# Patient Record
Sex: Male | Born: 1970 | Race: White | Hispanic: No | Marital: Married | Smoking: Never smoker
Health system: Southern US, Community
[De-identification: ages and names within clinical notes are randomized; demographics above are authoritative.]

## PROBLEM LIST (undated history)

## (undated) DIAGNOSIS — R0789 Other chest pain: Secondary | ICD-10-CM

## (undated) DIAGNOSIS — E119 Type 2 diabetes mellitus without complications: Secondary | ICD-10-CM

## (undated) DIAGNOSIS — M5136 Other intervertebral disc degeneration, lumbar region: Secondary | ICD-10-CM

## (undated) DIAGNOSIS — G4730 Sleep apnea, unspecified: Secondary | ICD-10-CM

## (undated) DIAGNOSIS — M51369 Other intervertebral disc degeneration, lumbar region without mention of lumbar back pain or lower extremity pain: Secondary | ICD-10-CM

## (undated) DIAGNOSIS — I456 Pre-excitation syndrome: Secondary | ICD-10-CM

## (undated) DIAGNOSIS — E785 Hyperlipidemia, unspecified: Secondary | ICD-10-CM

## (undated) DIAGNOSIS — M5126 Other intervertebral disc displacement, lumbar region: Secondary | ICD-10-CM

## (undated) DIAGNOSIS — T7840XA Allergy, unspecified, initial encounter: Secondary | ICD-10-CM

## (undated) DIAGNOSIS — J302 Other seasonal allergic rhinitis: Secondary | ICD-10-CM

## (undated) DIAGNOSIS — I4510 Unspecified right bundle-branch block: Secondary | ICD-10-CM

## (undated) DIAGNOSIS — K219 Gastro-esophageal reflux disease without esophagitis: Secondary | ICD-10-CM

## (undated) DIAGNOSIS — R0683 Snoring: Secondary | ICD-10-CM

## (undated) HISTORY — DX: Type 2 diabetes mellitus without complications: E11.9

## (undated) HISTORY — DX: Sleep apnea, unspecified: G47.30

## (undated) HISTORY — DX: Unspecified right bundle-branch block: I45.10

## (undated) HISTORY — DX: Other chest pain: R07.89

## (undated) HISTORY — PX: NO PAST SURGERIES: SHX2092

## (undated) HISTORY — PX: WISDOM TOOTH EXTRACTION: SHX21

## (undated) HISTORY — DX: Other intervertebral disc displacement, lumbar region: M51.26

## (undated) HISTORY — DX: Other intervertebral disc degeneration, lumbar region without mention of lumbar back pain or lower extremity pain: M51.369

## (undated) HISTORY — DX: Gastro-esophageal reflux disease without esophagitis: K21.9

## (undated) HISTORY — DX: Other intervertebral disc degeneration, lumbar region: M51.36

## (undated) HISTORY — DX: Allergy, unspecified, initial encounter: T78.40XA

## (undated) HISTORY — DX: Snoring: R06.83

## (undated) HISTORY — DX: Hyperlipidemia, unspecified: E78.5

## (undated) HISTORY — PX: SHOULDER SURGERY: SHX246

## (undated) HISTORY — DX: Other seasonal allergic rhinitis: J30.2

## (undated) HISTORY — DX: Pre-excitation syndrome: I45.6

---

## 2005-03-31 ENCOUNTER — Inpatient Hospital Stay (HOSPITAL_COMMUNITY): Admission: EM | Admit: 2005-03-31 | Discharge: 2005-04-02 | Payer: Self-pay | Admitting: Emergency Medicine

## 2005-04-01 ENCOUNTER — Encounter (INDEPENDENT_AMBULATORY_CARE_PROVIDER_SITE_OTHER): Payer: Self-pay | Admitting: *Deleted

## 2005-04-23 ENCOUNTER — Encounter: Admission: RE | Admit: 2005-04-23 | Discharge: 2005-04-23 | Payer: Self-pay | Admitting: Gastroenterology

## 2005-04-30 ENCOUNTER — Ambulatory Visit (HOSPITAL_COMMUNITY): Admission: RE | Admit: 2005-04-30 | Discharge: 2005-04-30 | Payer: Self-pay | Admitting: Urology

## 2005-10-08 ENCOUNTER — Encounter: Admission: RE | Admit: 2005-10-08 | Discharge: 2005-10-08 | Payer: Self-pay | Admitting: Sports Medicine

## 2006-02-06 ENCOUNTER — Ambulatory Visit (HOSPITAL_BASED_OUTPATIENT_CLINIC_OR_DEPARTMENT_OTHER): Admission: RE | Admit: 2006-02-06 | Discharge: 2006-02-06 | Payer: Self-pay | Admitting: Orthopedic Surgery

## 2006-10-05 ENCOUNTER — Encounter: Admission: RE | Admit: 2006-10-05 | Discharge: 2006-10-05 | Payer: Self-pay | Admitting: Urology

## 2006-10-28 ENCOUNTER — Ambulatory Visit (HOSPITAL_COMMUNITY): Admission: RE | Admit: 2006-10-28 | Discharge: 2006-10-28 | Payer: Self-pay | Admitting: Urology

## 2007-06-06 IMAGING — US US ABDOMEN COMPLETE
1 series · 13 of 25 positions shown · non-contrast
Comparison: none

CLINICAL DATA: Cystic mass upper pole right kidney, follow-up.
 ABDOMEN ULTRASOUND:
TECHNIQUE: Complete abdominal ultrasound examination was performed including evaluation of the liver, gallbladder, bile ducts, pancreas, kidneys, spleen, IVC, and abdominal aorta.
 The complex septated hypoechogenic lesion of the upper pole of the right kidney has not changed when compared to the ultrasound of 04/23/05 and to the MR scan of 04/30/05.  This complex cystic lesion measures 2.0 x 1.4 x 2.0 cm.  There is minimal blood flow on the periphery of this lesion.  One additional follow-up ultrasound in one year is recommended and, if this area stays stable at that time, then no further assessment may be necessary.  The right kidney measures 9.8 cm sagittally with the left kidney measuring 9.7 cm.  The remainder of the study shows small gallbladder polyp of 3 mm.  No gallstones are seen. The liver has a normal echogenic pattern.   The common bile duct is normal measuring 2.4 mm.  The pancreas is not well seen due to bowel gas.  Spleen is normal in size.  The abdominal aorta appears normal.

[Series 1: us abdomen complete · 0.26mm/px · 13 of 95 slices shown]
[im 1/95]
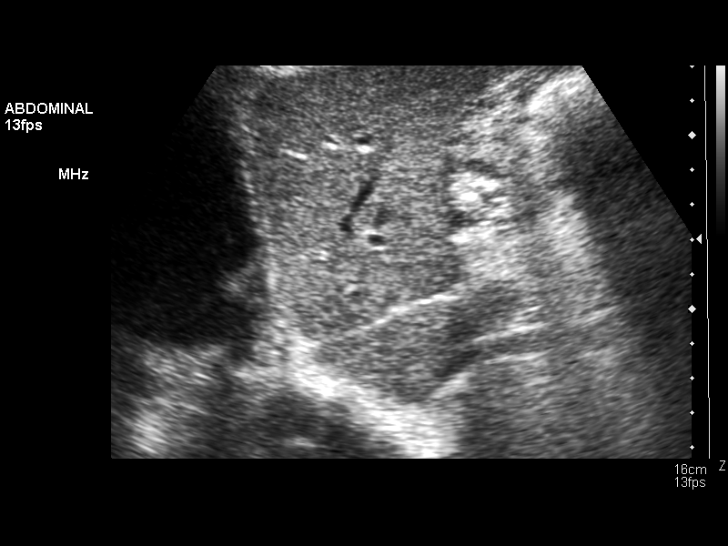
[im 8/95]
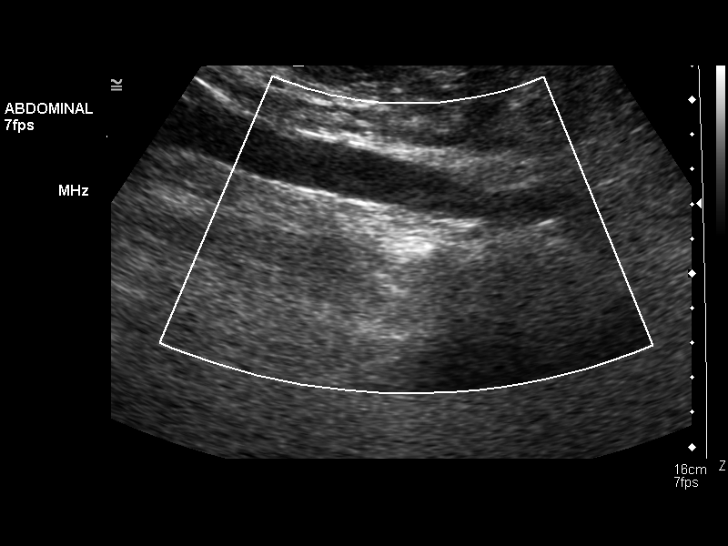
[im 16/95]
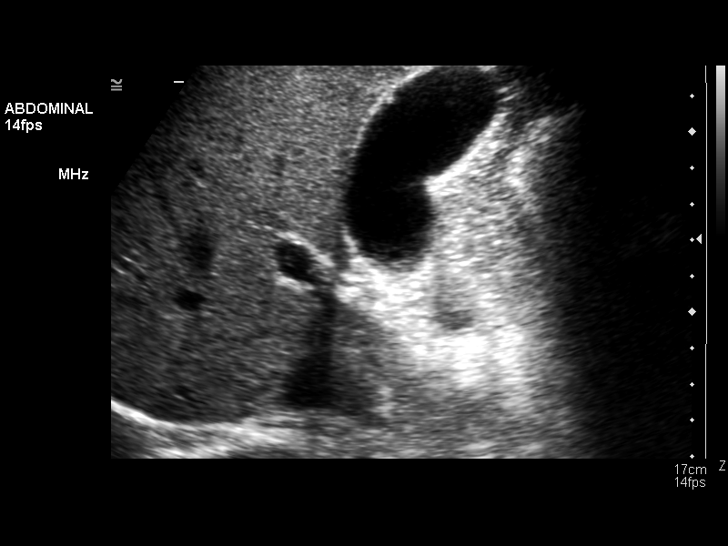
[im 24/95]
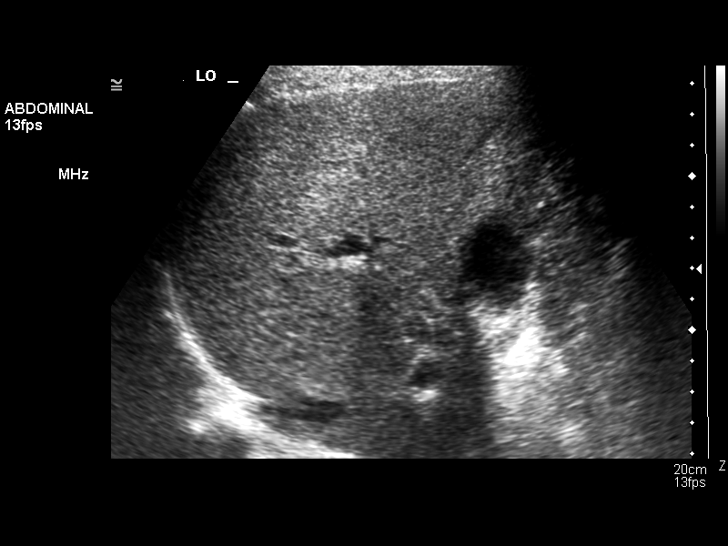
[im 32/95]
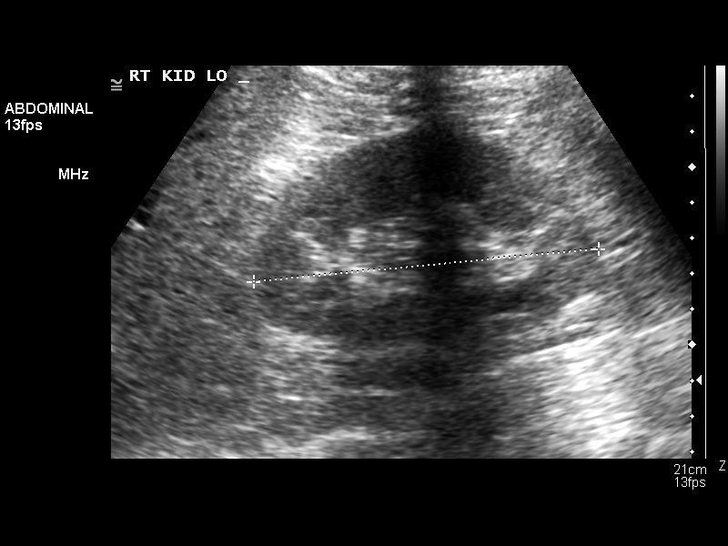
[im 40/95]
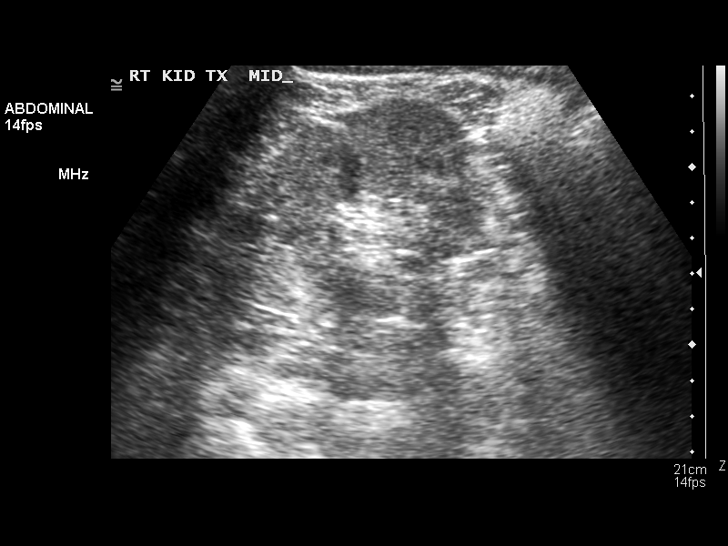
[im 48/95]
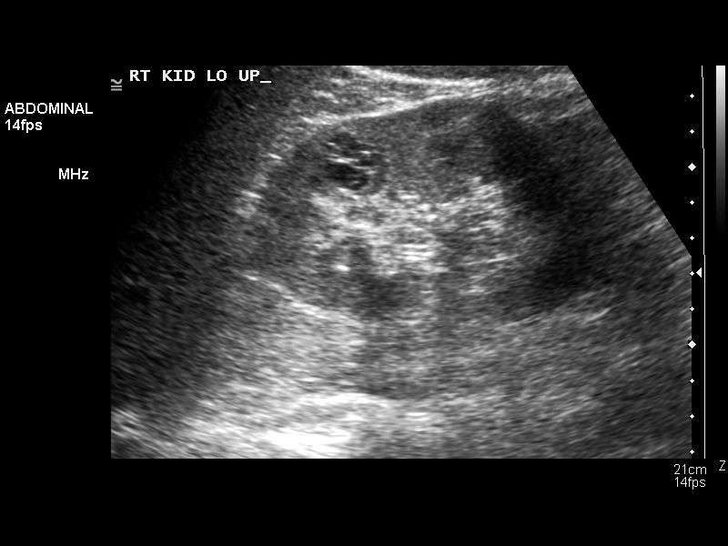
[im 55/95]
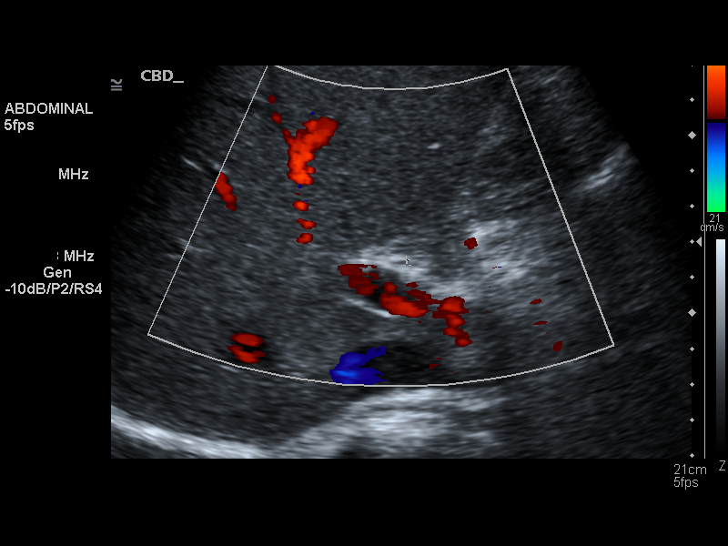
[im 63/95]
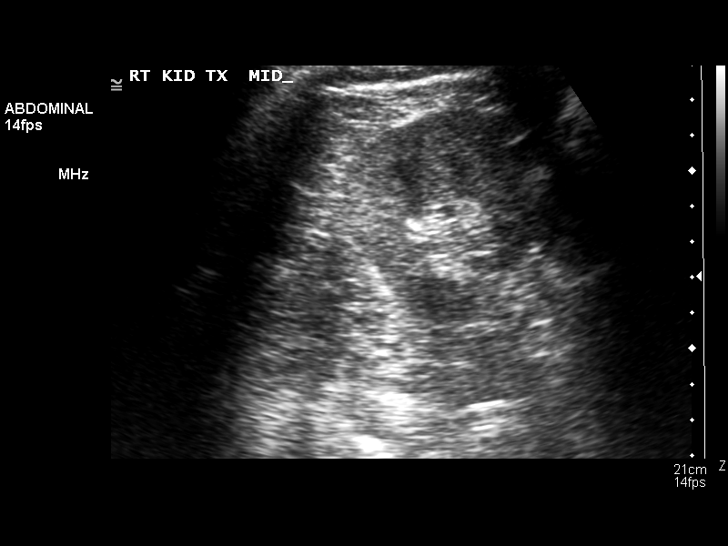
[im 71/95]
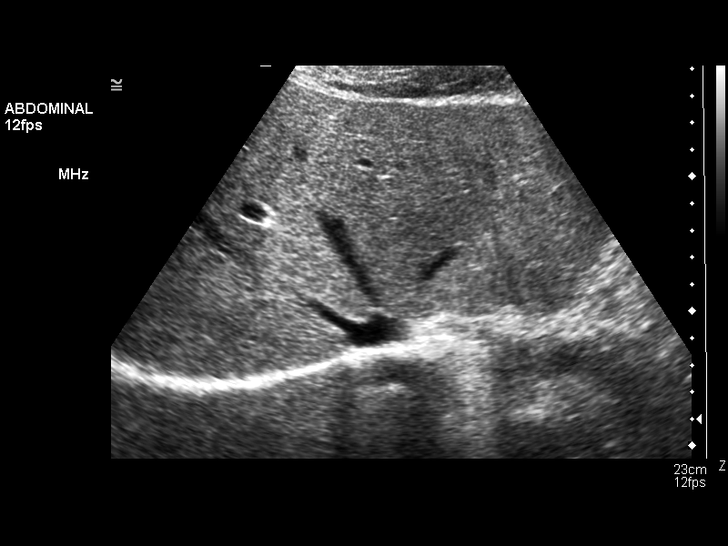
[im 79/95]
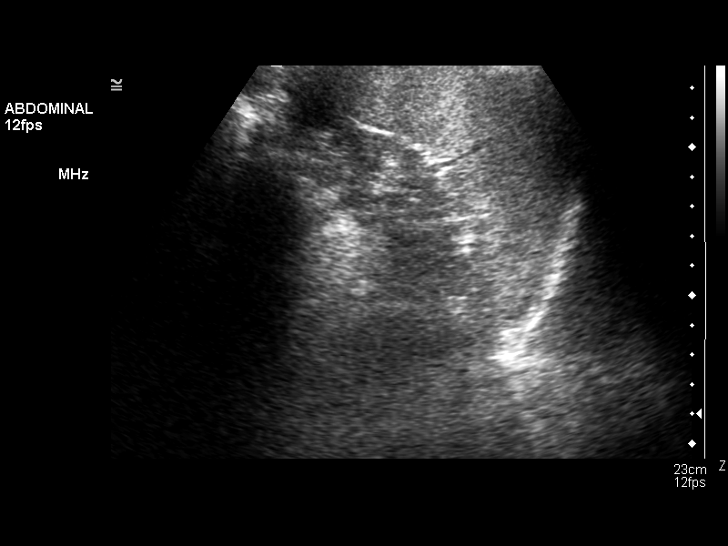
[im 87/95]
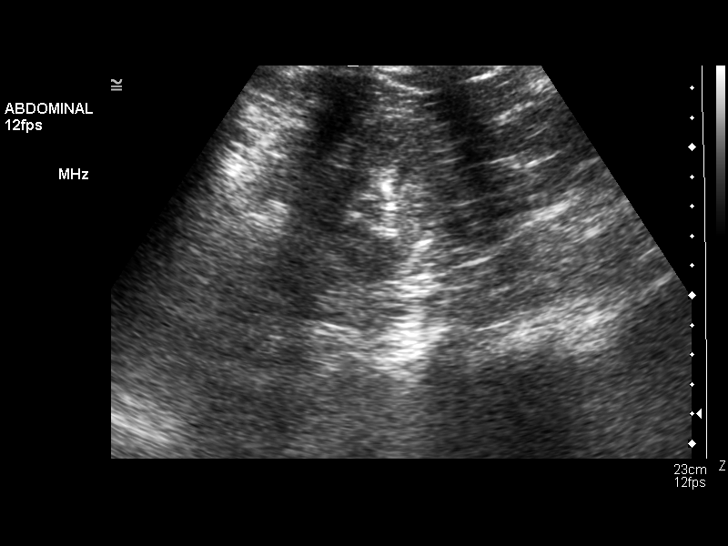
[im 95/95]
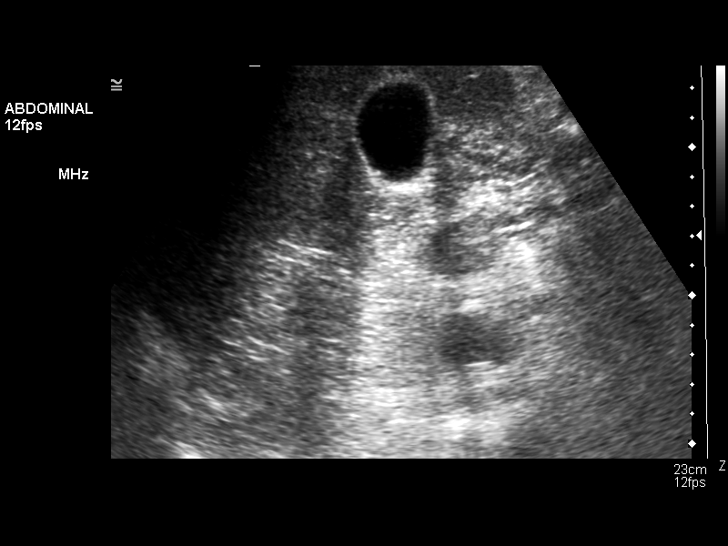

[13 of 25 positions shown; findings below may reference images not displayed]

IMPRESSION: 1.  Stable complex septated cystic structure in the upper pole of the right kidney of 2.0 cm.  Recommend one additional follow-up ultrasound in one year.
 2 . Single 3 mm gallbladder polyp.  No gallstones.
 3.  Pancreas not well seen.

## 2008-06-02 IMAGING — US US RENAL
1 series · 13 of 24 positions shown · non-contrast
Comparison: Abdominal ultrasound, 10/08/05.

CLINICAL DATA: Follow up complex right renal cyst.
 RENAL ULTRASOUND:
TECHNIQUE: Complete ultrasound examination of the urinary tract was performed including evaluation of the kidneys, renal collecting systems, and urinary bladder.

[Series 1: unknown · 0.22mm/px · 13 of 24 slices shown]
[im 1/24]
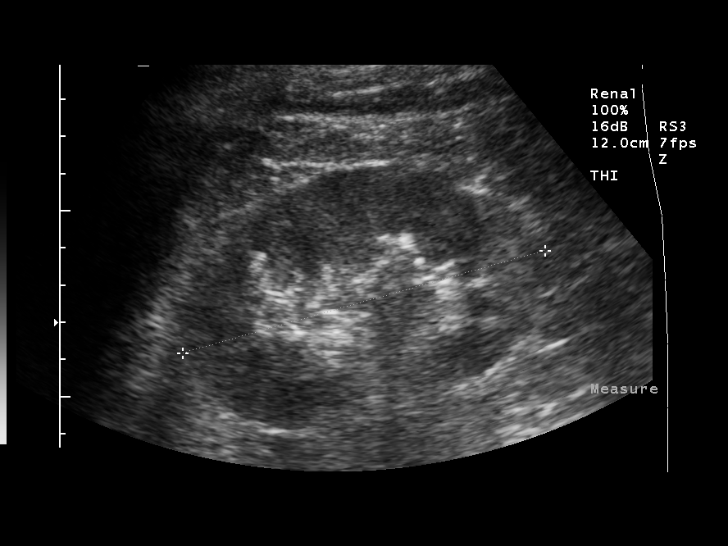
[im 3/24]
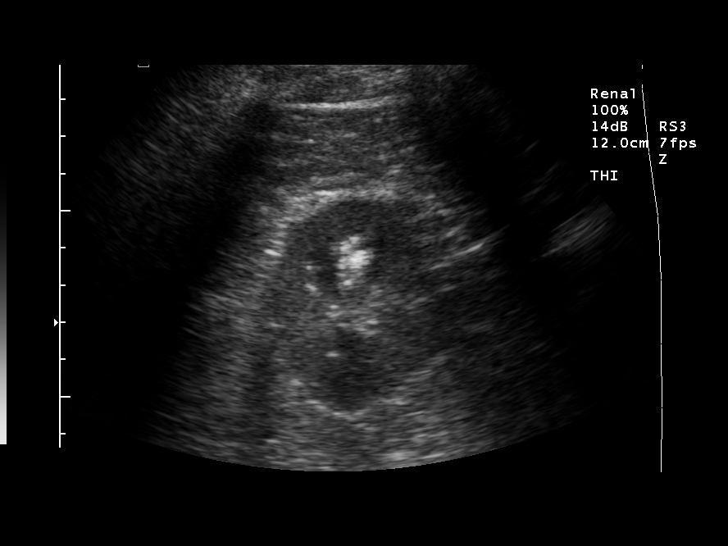
[im 5/24]
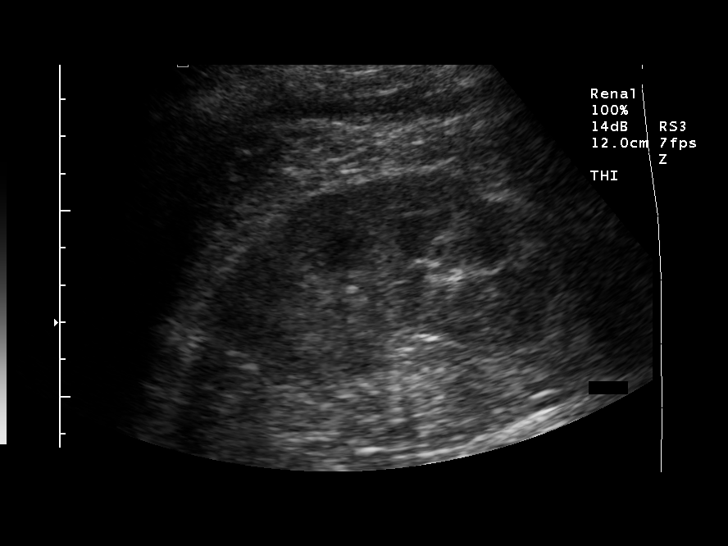
[im 7/24]
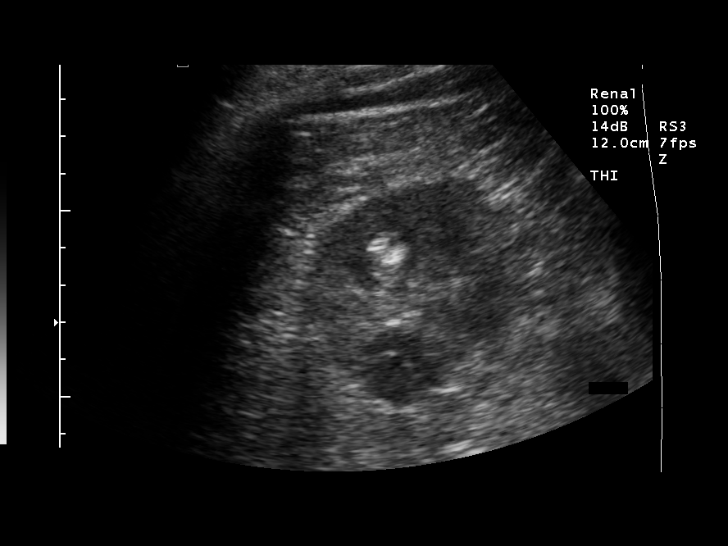
[im 9/24]
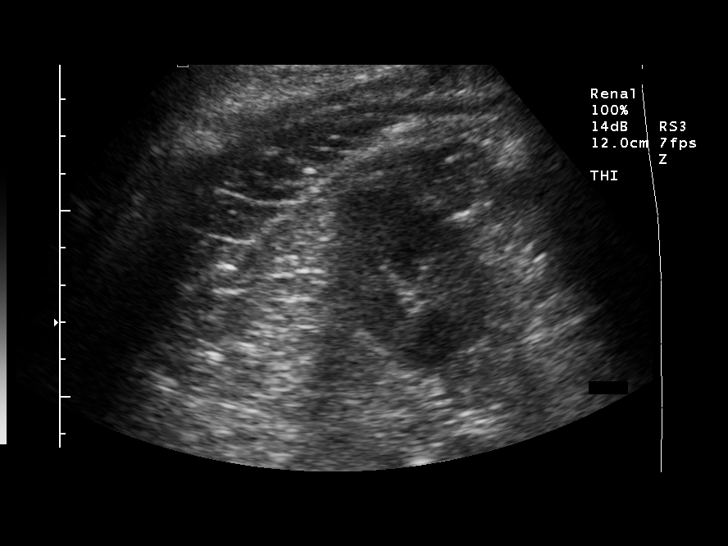
[im 11/24]
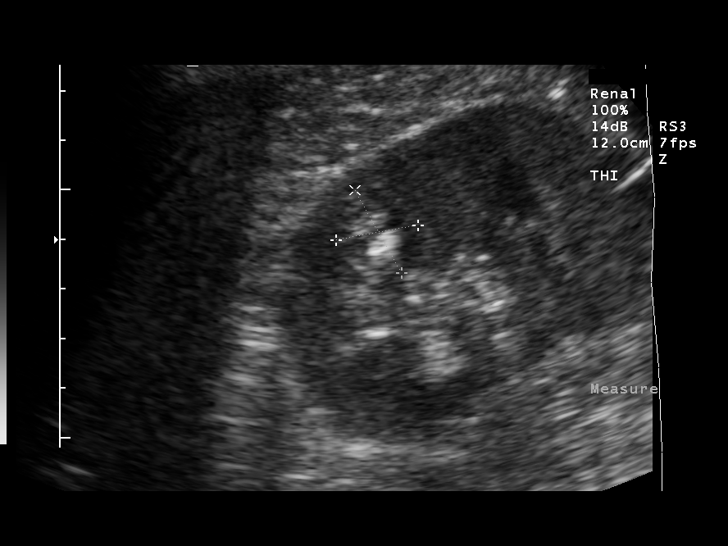
[im 13/24]
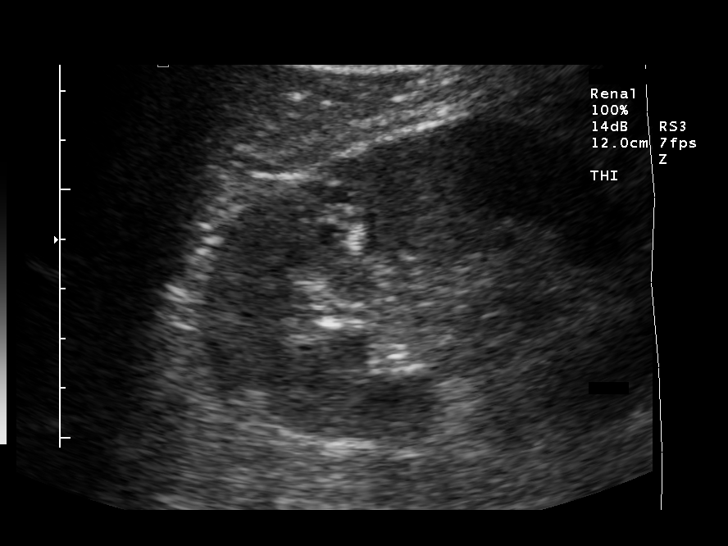
[im 14/24]
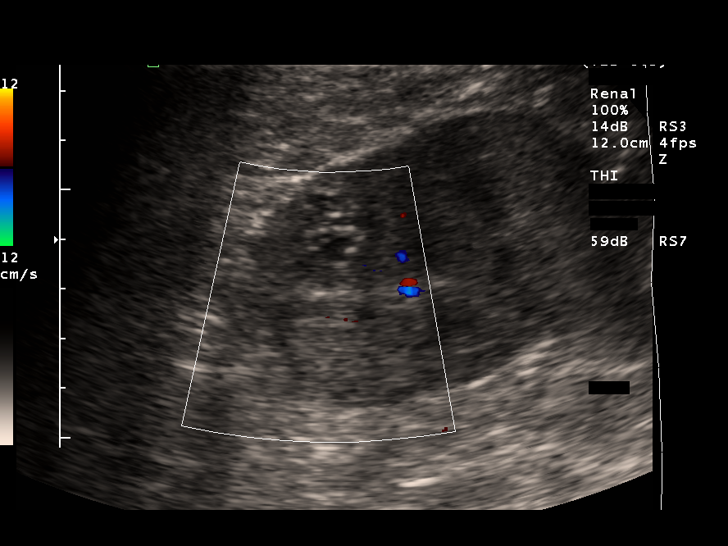
[im 16/24]
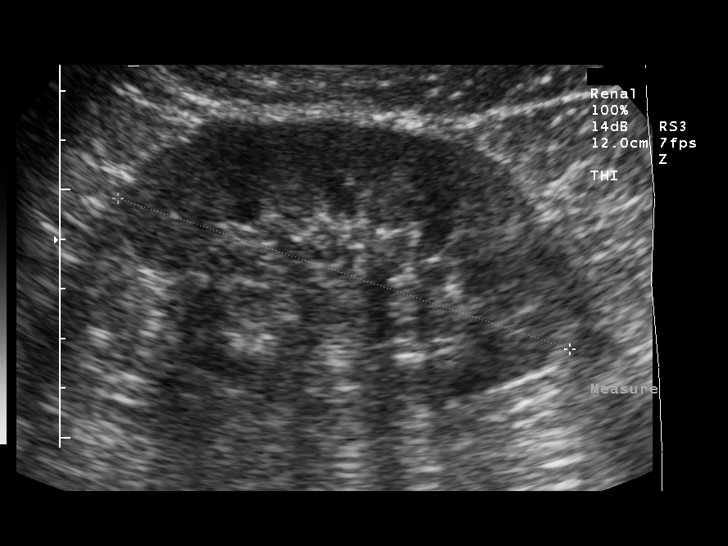
[im 18/24]
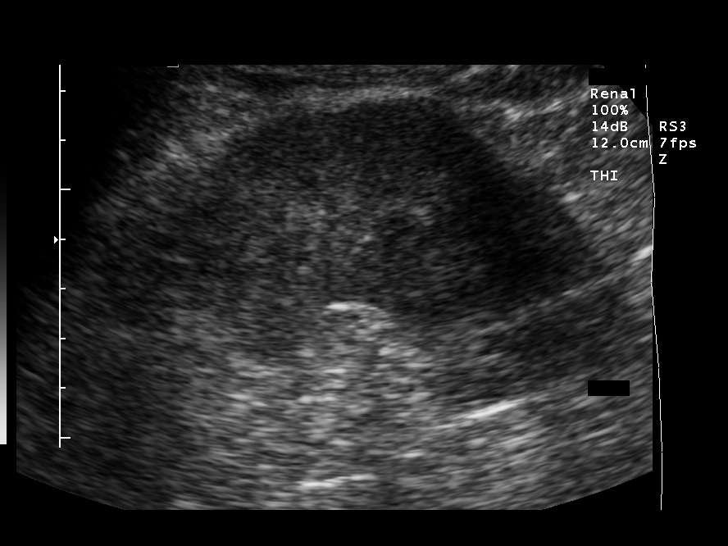
[im 20/24]
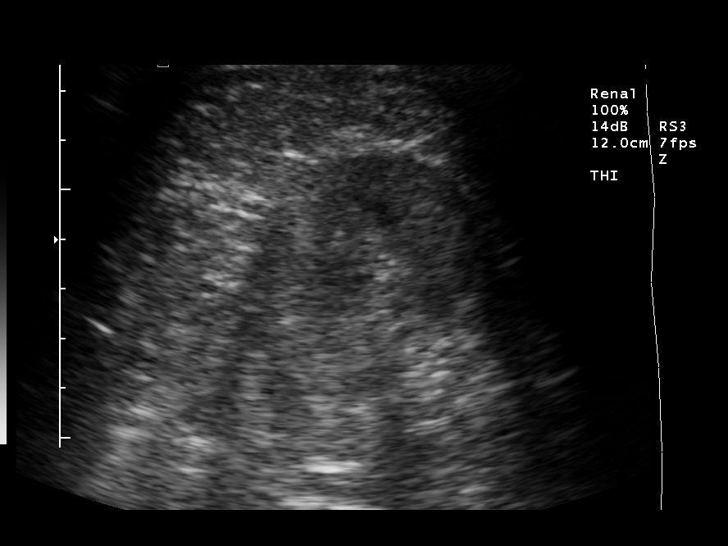
[im 22/24]
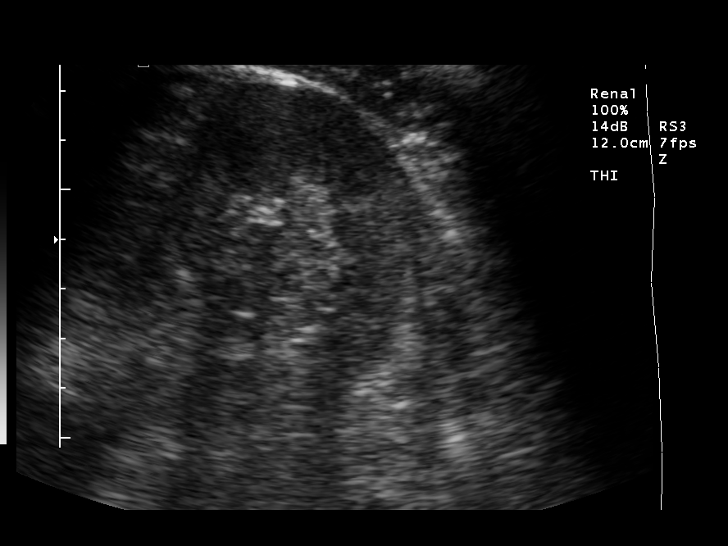
[im 24/24]
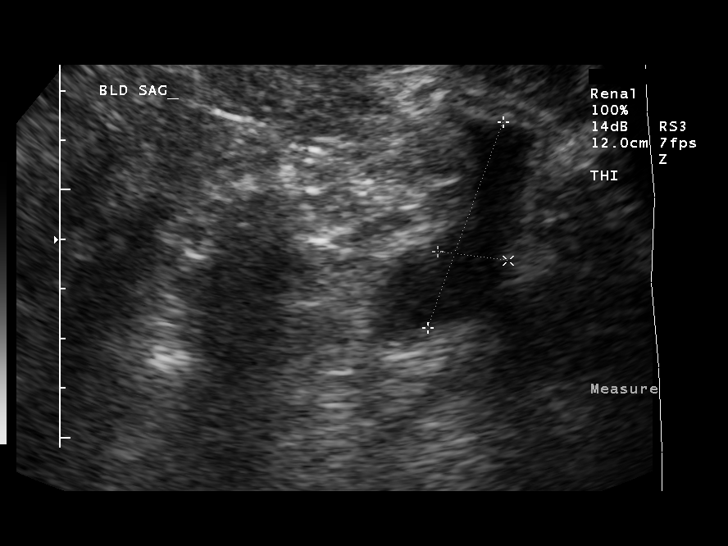

[13 of 24 positions shown; findings below may reference images not displayed]

FINDINGS: A complex lesion in the upper pole of the right kidney is again noted.  It measures 1.7 x 1.9 x 1.9 cm.  Previously it was measured at 2.0 x 2.0 x 1.4 cm.  The lesion is, therefore, not enlarged.   However, the internal contents are far more echo dense than before suggesting calcification.  Lack of interval growth strongly suggested this a benign lesion, even though it has developed calcification within it.  One might consider a follow-up ultrasound in 6 to 12 months to assure ourselves that this is not an occult neoplasm.  However, I believe it is highly unlikely since the lesion has not grown.  In fact, it measures slightly smaller.  
 No other lesions are identified.  Kidney size remains normal with the right 10.1 and the left 9.6 cm.
IMPRESSION: Complex lesion in the upper pole of the right kidney with interval development of central calcifications, but no increase in overall growth (in fact, the lesion measures slightly smaller).  See discussion.

## 2008-06-19 IMAGING — CR DG CHEST 2V
2 series · 2 of 2 positions shown · non-contrast
Comparison: 03/31/05.

CLINICAL DATA: Preop for hernia repair and vasectomy.  
 CHEST ? 2 VIEW:

[w chest pa]
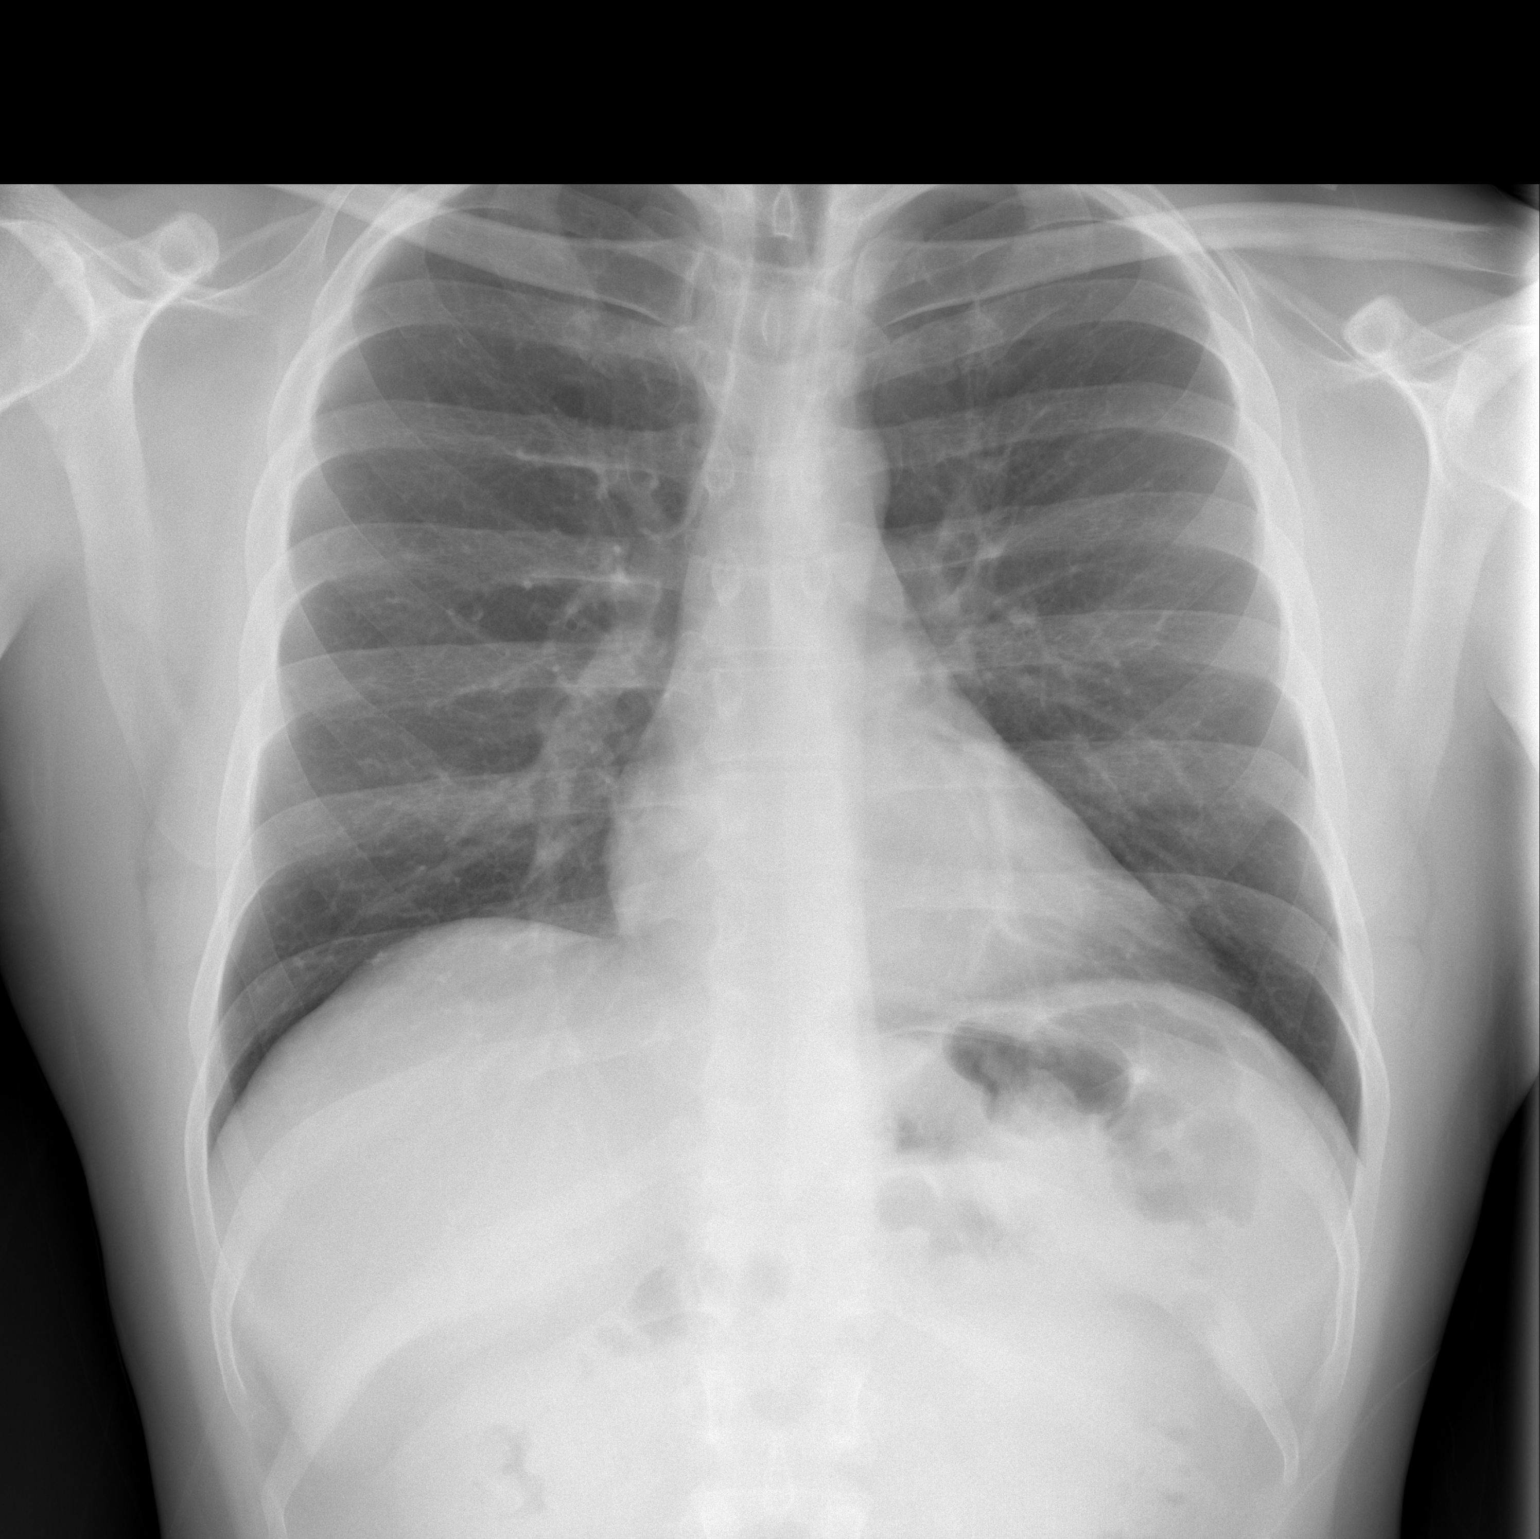

[w chest lat]
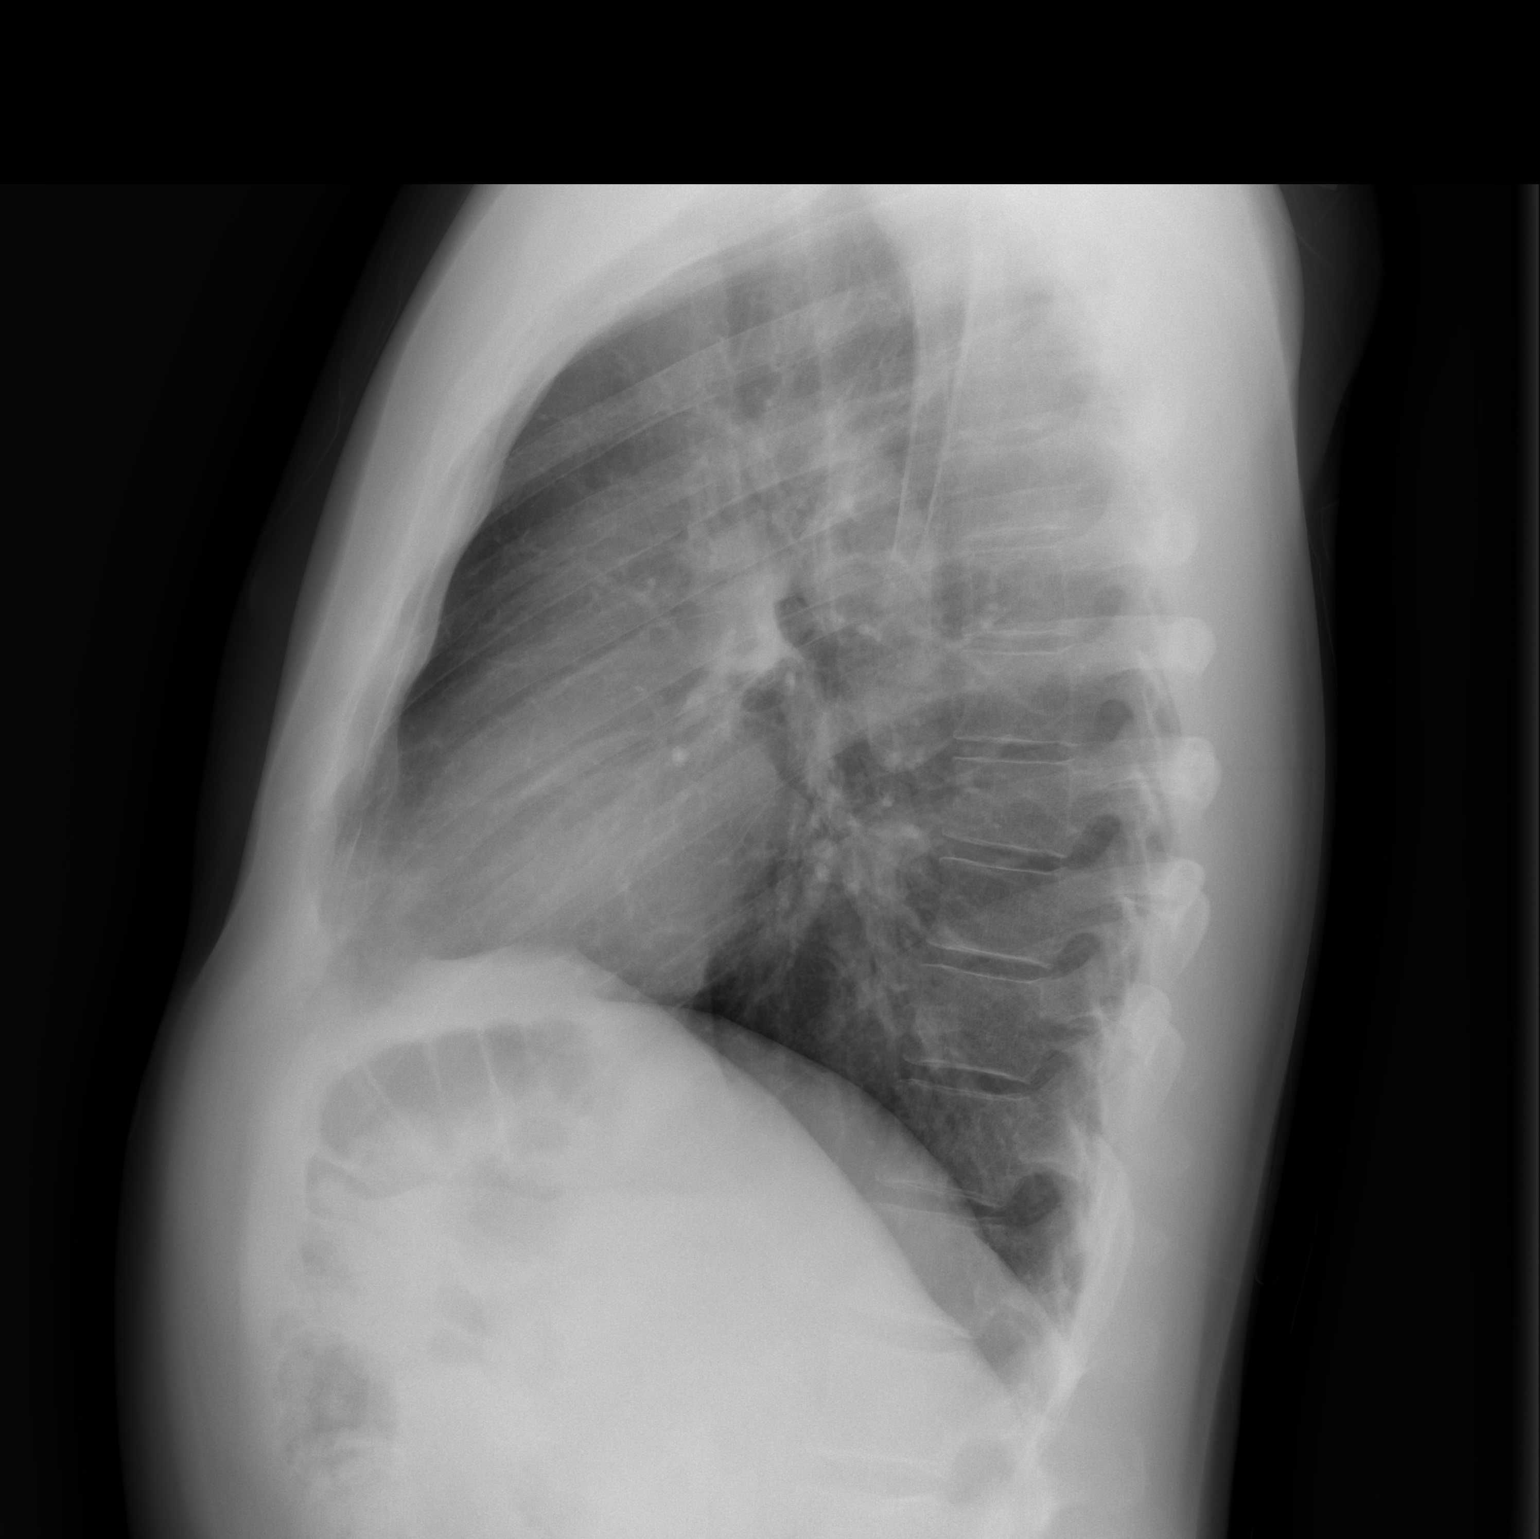

[2 of 2 positions shown; findings below may reference images not displayed]

FINDINGS: The heart size and mediastinal contours are within normal limits.  Both lungs are clear.  The visualized skeletal structures are unremarkable.
IMPRESSION: No active cardiopulmonary disease.

## 2009-03-16 ENCOUNTER — Ambulatory Visit (HOSPITAL_BASED_OUTPATIENT_CLINIC_OR_DEPARTMENT_OTHER): Admission: RE | Admit: 2009-03-16 | Discharge: 2009-03-16 | Payer: Self-pay | Admitting: Orthopedic Surgery

## 2010-03-03 HISTORY — PX: SHOULDER ARTHROSCOPY WITH ROTATOR CUFF REPAIR AND OPEN BICEPS TENODESIS: SHX6677

## 2010-03-24 ENCOUNTER — Encounter: Payer: Self-pay | Admitting: Family Medicine

## 2010-07-16 NOTE — Op Note (Signed)
Connor Tate, Connor Tate               ACCOUNT NO.:  [id number]   MEDICAL RECORD NO.:  [id number]          PATIENT TYPE:  AMB   LOCATION:  DAY                          FACILITY:  [facility]   PHYSICIAN:  Lindaann Slough, M.D.  DATE OF BIRTH:  19-Nov-1970   DATE OF PROCEDURE:  10/28/2006  DATE OF DISCHARGE:                               OPERATIVE REPORT   PREOPERATIVE DIAGNOSIS:  Elective sterilization and right inguinal  hernia.   POSTOPERATIVE DIAGNOSIS:  Elective sterilization and right inguinal  hernia.   PROCEDURE:  Bilateral vasectomy by Dr. Judie Petit. Nesi and right inguinal hernia  repair by Dr. Lurene Shadow,   SURGEON:  Danae Chen, M.D.   ASSISTANT:  Leonie Man, M.D.   ANESTHESIA:  General.   INDICATIONS:  The patient is a 40 years old male who was seen in the  office on October 01, 2006 for vasectomy consultation.  He has two children  age 81 and six and both him and his wife think that their family is  complete.  He was found on physical examination to have a right inguinal  hernia and was referred to Dr. Lurene Shadow for evaluation and management.  He  is scheduled today for right inguinal hernia repair and bilateral  vasectomy.   Under general anesthesia the patient was prepped and draped and placed  in the supine position.  A right inguinal hernia repair was done by Dr.  Lurene Shadow.  He will dictate this procedure separately.   On the right side the srotal segment of the cord was pulled into the  inguinal incision and the vas was dissected from the rest of the cord  and a segment of the vas was excised.  Each end of the vas was then  fulgurated and ligated with #0 Vicryl.  The proximal end of the vas was  then covered with the vas sheath and then the cord was pulled back in  the scrotum.  Then Dr. Lurene Shadow completed the inguinal hernia repair.  On  the left side I made a poke hole on the scrotum and then the vas was  then secured with a vas clamp.  The vas was then dissected from the vas  sheath and a segment of the vas was excised.  Each end of the vas was  then fulgurated with the electrocautery.  The proximal end of the vas  was then covered with the vas sheath and the distal end was left in the  subcutaneous tissues.  The skin incision was then closed with #3-0  Vicryl.   The patient tolerated the procedure well.      Lindaann Slough, M.D.  Electronically Signed     MN/MEDQ  D:  10/28/2006  T:  10/29/2006  Job:  [id number]

## 2010-07-16 NOTE — Op Note (Signed)
[name], [name] NO.:  [id number]   MEDICAL RECORD NO.:  [id number]          PATIENT TYPE:  AMB   LOCATION:  DAY                          FACILITY:  [facility]   PHYSICIAN:  Leonie Man, M.D.   DATE OF BIRTH:  1970-06-25   DATE OF PROCEDURE:  10/28/2006  DATE OF DISCHARGE:                               OPERATIVE REPORT   PREOPERATIVE DIAGNOSIS:  Right inguinal hernia.   POSTOPERATIVE DIAGNOSIS:  Right inguinal hernia.   PROCEDURE:  Right inguinal herniorrhaphy with mesh.   SURGEON:  Leonie Man, M.D.   ASSISTANT:  Lindaann Slough, M.D.   ANESTHESIA:  General.   SPECIMENS:  There are no specimens sent to the lab.   DISPOSITION:  The patient returned to the PACU in excellent condition.   BLOOD LOSS:  Minimal.   INDICATIONS:  The patient is a 40 year old man who presents to his  urologist requesting bilateral vasectomy.  At that time he was noted to  have a right-sided inguinal hernia.  Our plan was to repair the right  inguinal hernia in conjunction with the urologist, Dr. Su Grand who  would perform his vasectomy.  Please note that the procedure for the  vasectomy is dictated in a separate note.   The risks and potential benefits of surgery have been fully discussed  with the patient, all questions answered and consent obtained.   DESCRIPTION OF PROCEDURE:  Following the induction of satisfactory  general anesthesia with the patient positioned supinely, the lower  abdomen and scrotum and penis are prepped and draped to be included in a  sterile operative field.  Positive identification of the patient as  Connor Tate and the operation is  right inguinal hernia and bilateral vasectomies was made.   A transverse incision in the right lower quadrant was made, deepened  through the skin and subcutaneous tissues down to the external oblique  aponeurosis.  The external oblique aponeurosis was opened up through the  external inguinal ring with  protection of the ilioinguinal nerve. The  spermatic cord was elevated and held with a Penrose drain.   Dissection along the anterior medial aspect of the spermatic cord showed  a very large cord lipoma which was dissected free from the cord contents  and carried up to the internal ring.  The lipoma was explored and noted  to contain only fatty tissue; and there was no sac.  Lipoma was then  suture ligated at its base, and transected with the stump being released  into the retroperitoneum.   In the direct space there was a large direct inguinal hernia; and this  was repaired with an onlay patch of polypropylene mesh which was sewn in  at the pubic tubercle, carried up along the conjoined tendon with a  running suture of 2-0 Novofil up to the internal ring; and , again, from  the pubic tubercle up along the shelving edge of Poupart's ligament up  to the internal ring.  The mesh was split so as to allow the protrusion  of the spermatic cord between the leaflets of the mesh; and of  tails of  the mesh were then sutured down to the internal oblique muscle behind  the cord.   The area between the cord and mesh was then tested; and there was no  vascular compromise.  At this point Dr. Brunilda Payor performed a vasectomy on  the right side.  The sponge, instruments, and sharp counts were then  verified on the right side.  All areas of dissection were checked for  hemostasis and noted to be dry.  The external oblique aponeurosis was  closed with a running suture of 2-0 Vicryl, thus reapproximating the  external ring.  Scarpa fascia and the subcutaneous tissues were closed  with a running suture of 3-0 Vicryl; and the skin closed with a running  4-0 Monocryl suture; and then reinforced with Steri-Strips.   In the interim Dr. Brunilda Payor performed a vasectomy on the left side.  Sterile  dressing were applied to all incisions, anesthetic reversed.  The  patient removed from the operating room to the recovery  room in stable  condition.  He tolerated the procedure well.      Leonie Man, M.D.  Electronically Signed     PB/MEDQ  D:  10/28/2006  T:  10/29/2006  Job:  [id number]   cc:   Lindaann Slough, M.D.  Fax: [phone]

## 2010-07-19 NOTE — H&P (Signed)
NAME:  BOWEN, KIA               ACCOUNT NO.:  [id number]   MEDICAL RECORD NO.:  [id number]          PATIENT TYPE:  EMS   LOCATION:  MAJO                         FACILITY:  [facility]   PHYSICIAN:  Melissa L. Ladona Ridgel, MD  DATE OF BIRTH:  [birth year]   DATE OF ADMISSION:  03/31/2005  DATE OF DISCHARGE:                                HISTORY & PHYSICAL   CHIEF COMPLAINT:  Chest pain.   PRIMARY CARE PHYSICIAN:  Dr. Blossom Hoops   HISTORY OF PRESENT ILLNESS:  The patient is a 40 year old white male with 2-  3 years of on-and-off chest discomfort which he describes as being 10/10  that cause him to drop to the ground to his knees with central location and  associated shortness of breath. They usually resolve spontaneously or with  stretching. The patient states that he had never spoke to anybody about  these including his physician, but over the last 3 weeks he has had  worsening episodes of increased frequency and intensity. He states that last  night he was asleep when he was awakened from sleep with associated chest  pain centrally located and short of breath. He stood up and went downstairs  and took some aspirin. He states that the pain eventually went away and so  he got on the Internet to look and see what possible diagnoses he could  have. This morning his wife encouraged him to go to his physician and  therefore he called Dr. Laqueta Linden office, who directed him to the  emergency room.   REVIEW OF SYSTEMS:  Shows that his pain is usually associated with some  nausea but no vomiting. It has woken him from sleep. It does not radiate  down his arms or to his back. All other review of systems appear to be  negative.   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   PAST SURGICAL HISTORY:  None other than some epidural steroid injections for  his back.   SOCIAL HISTORY:  He does not smoke. He drinks maybe monthly. He denies any  illicit drug use. 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   FAMILY HISTORY:  Mom had a myocardial infarction at age 24. Dad is unknown  to him. He does have a step-father. He does know that his granddad had a  myocardial infarction which caused his death; age is unknown.   ALLERGIES:  SULFA.   MEDICATIONS:  Flexeril as needed for his back, and he has not taken that in  a while.   PHYSICAL EXAMINATION:  VITAL SIGNS:  Temperature is 98.2, blood pressure  143/81, pulse is 62, respirations 14, saturation 99%.  GENERAL:  He is in no acute distress. At the present he has no pain.  HEENT:  He is normocephalic, atraumatic. Pupils equal, round, and reactive  to light. Extraocular muscles intact. Mucous membranes are moist.  NECK:  Supple. There is no JVD, no lymph nodes, and no carotid bruits.  CHEST:  Clear to auscultation. There is no rhonchi, rales, or wheezes.  CARDIOVASCULAR:  There is regular  rate and rhythm. Positive S1, S2. No S3,  S4. No murmurs, rubs, or gallops are noted.  ABDOMEN:  Soft, nontender, nondistended, with positive bowel sounds.  EXTREMITIES:  Show no clubbing, cyanosis, or edema.  NEUROLOGIC:  He is awake, alert, oriented x3. Cranial nerves II-XII are  intact. Power is 5/5. DTRs are 2+.   LABORATORY:  Reveal point-of-care enzymes are negative x3. EKG shows an  incomplete bundle-branch block with right axis deviation, heart rate of 74.  Creatinine is 0.9, sodium is 136, potassium 4.0, chloride 107, CO2 is 30,  BUN is 11, creatinine 0.9, glucose is 91.   ASSESSMENT AND PLAN:  This is a 40 year old white male with 2-3 years of on-  and-off acute chest pain with varying settings of onset. It resolves  spontaneously or with stretching. The last 3 weeks the patient has an  increased frequency and intensity of episodes. His last event was last p.m.  which woke him from sleep and was associated with nausea and shortness of  breath. The patient took two aspirin and then went on the Internet to see  if  he could describe what was happening.   1.  Cardiovascular. Atypical chest pain with limited risk factors being male      sex and the fact that his mother had an MI at the age of 66. We will      place him on aspirin, Lovenox, Lopressor, and cardiology consult has      been called for potential stress testing.  2.  Pulmonary. Check a D-dimer and consider CT of the chest before      proceeding with a full cardiac workup if appropriate with cardiology.  3.  GI. I would like to start a proton pump inhibitor.  4.  GU. Will check a urine drug screen.  5.  Endocrine. Will check a TSH, lipid panel, and a hemoglobin A1c.      Melissa L. Ladona Ridgel, MD  Electronically Signed     MLT/MEDQ  D:  03/31/2005  T:  03/31/2005  Job:  [id number]   cc:   Leanne Chang, M.D.  Fax: [phone]

## 2010-07-19 NOTE — Cardiovascular Report (Signed)
Connor Tate, PORTELL NO.:  [id number]   MEDICAL RECORD NO.:  [id number]          PATIENT TYPE:  INP   LOCATION:  3738                         FACILITY:  [facility]   PHYSICIAN:  Meade Maw, M.D.    DATE OF BIRTH:  26-Mar-1970   DATE OF PROCEDURE:  04/02/2005  DATE OF DISCHARGE:                              CARDIAC CATHETERIZATION   .   INDICATIONS FOR PROCEDURE:  Positive troponins, ongoing chest pain.   PROCEDURE:  Obtaining written informed consent the patient was brought to  the cardiac catheterization lab in a post absorptive state. Preop sedation  was achieved using Versed 3 mg IV. He right groin was prepped and draped in  usual sterile fashion. Local anesthesia was achieved using 1% Xylocaine. A 6-  [place] hemostasis sheath was placed into the right femoral artery using  modified Seldinger technique. Selective coronary angiography was performed  using a 6-French JL-4 and a 6-French Pro-Flo and TR catheter. Single-plane  ventriculogram was performed in the RAO position using a 6-French pigtail  curved catheter. Of note, there was initial kinking of the pigtail catheter  which required a catheter exchange. The hemostasis sheath was flushed  following each catheter exchange. There were no immediate complications. The  patient was transferred to the holding area. Hemostasis was achieved using a  Fem-Stop device.   FINDINGS:  Aortic pressure was 112/82, LV pressure was 110/45. His EDP was  9. Single-plane ventriculogram revealed normal wall motion, ejection  fraction of 50-60%.   CORONARY ANGIOGRAPHY:  Left main coronary artery bifurcates into the left  anterior descending and circumflex vessel. There was no disease noted in the  left main coronary artery. The left anterior descending: Left anterior  descending gives rise to a small and moderately D1.  Small D2  goes on in as  an apical branch. There is no disease noted in the left anterior descending  or its  branches. Circumflex vessel: Circumflex vessel is a small caliber  vessel giving rise to a small OM-1, provides little circulation to the  lateral myocardium. Right coronary artery: Right coronary artery is a large  dominant artery, gives rise to two RV marginals and a large PDA and a  trifurcating PL branch . There is no disease noted in the right coronary  artery or its branches.   FINAL IMPRESSION:  1.  Normal coronary angiography, normal single-plane ventriculogram.  2.  False positive troponins.  3.  Other etiologies for his chest pain should be considered      Meade Maw, M.D.  Electronically Signed     HP/MEDQ  D:  04/02/2005  T:  04/02/2005  Job:  [id number]

## 2010-07-19 NOTE — Op Note (Signed)
NAME:  Connor Tate, Connor Tate               ACCOUNT NO.:  [id number]   MEDICAL RECORD NO.:  [id number]          PATIENT TYPE:  AMB   LOCATION:  DSC                          FACILITY:  [facility]   PHYSICIAN:  Harvie Junior, M.D.   DATE OF BIRTH:  Nov 26, 1970   DATE OF PROCEDURE:  02/06/2006  DATE OF DISCHARGE:                               OPERATIVE REPORT   PREOPERATIVE DIAGNOSIS:  Impingement, acromioclavicular joint arthritis,  potential biceps tendon issues, and some partial thickness rotator cuff  fray.   POSTOPERATIVE DIAGNOSES:  1. Impingement.  2. Acromioclavicular joint arthritis.  3. Anterior superior labral tear with extension back posteriorly to      the biceps tendon.  4. Undersurface and superior surface rotator cuff fray.   PROCEDURE:  1. Mini-open rotator cuff repair of acute rotator cuff tear.  2. Arthroscopic acromioplasty.  3. Arthroscopic distal clavicle resection.  4. Debridement of superior, anterior, and posterior superior labral      tear.   SURGEON:  Harvie Junior, M.D.   ASSISTANTOrma Flaming   ANESTHESIA:  General.   BRIEF HISTORY:  Mr. Connor Tate is a 40 year old male with a long history of  having had right shoulder pain which has been treated conservatively for  a long period of time.  Injection therapy had helped.  Because of the  continued complaints of pain, he was ultimately taken to the operating  room for subacromial decompression and distal clavicle resection and  evaluation as needed.   PROCEDURE:  The patient was taken to the operating room.  After adequate  anesthesia was obtained under general anesthetic, the patient was placed  on the operating room and the right shoulder was prepped and draped in  the usual sterile fashion.  After routine arthroscopic examination, the  shoulder revealed there was no significant biceps tendon pathology.  The  superior labrum was torn anteriorly and this did extend around  posteriorly so the superior labrum was  debrided within the glenohumeral  joint.  Rotator cuff was evaluated just posterior to the biceps tendon.  Unfortunately showed some partial thickness rotator cuff tear.  The  fibers had a funny look to them, and so what we did was mark this  location with a PDS suture and the rest of the glenohumeral joint showed  no evidence of arthritic change.  The labral pathology in the inferior  areas was fine.  Following this, attention was turned out of the  glenohumeral joint into the subacromial space.  The suture was  identified and there was a significant fray right at this level on the  superior surface as well.  Very concerning that there was inferior  surface fray, superior surface fray, and at that point felt that this  area was going to need to be exposed and retacked to bone.  At this  point, we did an anterolateral acromioplasty from the lateral and  posterior compartment, did a distal clavicle resection through an  anterior compartment, did a subtotal bursectomy and thorough debridement  and further evaluation of the rotator cuff.  You could kind of take a  shaver and almost poke it through at this one area that was involved on  the inferior and superior surface.  At this point, a small incision was  made over the lateral aspect of the shoulder.  Subcutaneous tissue was  taken down to the level of the deltoid.  A little small rent was made in  the deltoid in line with its fibers and the rotator cuff was identified  below that.  The area in question was easily identifiable and a probe  could be pushed through the tendon.  It could not get all the way into  the shoulder, but with one use of the rongeur, this came all the way  through and the water remaining in the shoulder came out through this  hole.  At this point, this area was freshened up, the bone was freshened  up.  A 5.5 suture anchor was placed and two sutures were used to pull  this area down.  We were able to pass these  sutures laterally to get an  excellent repair of the rotator cuff through the lateral cuff after they  were tied down right over the suture anchor.  We put the arm through a  range of motion at this point, had full range of motion.  So, at this  point, the deltoid was closed with a 1 Vicryl running suture after the  shoulder was copiously irrigated and suctioned dry.  Following this, the  skin was closed with 0 and 2-0 Vicryl and a 3-0 Maxon pull-out suture.  Benzoin and Steri-Strips were applied.  Sterile compressive dressing was  applied.  The patient was taken to the recovery room and was noted to be  in satisfactory condition.  Estimated blood loss for the procedure was  __________.      Harvie Junior, M.D.  Electronically Signed     JLG/MEDQ  D:  02/06/2006  T:  02/07/2006  Job:  [id number]

## 2010-12-13 LAB — COMPREHENSIVE METABOLIC PANEL
ALT: 21
CO2: 29
Calcium: 9.3
Creatinine, Ser: 0.99
GFR calc Af Amer: >60
GFR calc non Af Amer: >60
Glucose, Bld: 90
Sodium: 141
Total Bilirubin: 1.1

## 2010-12-13 LAB — URINALYSIS, ROUTINE W REFLEX MICROSCOPIC
Bilirubin Urine: NEGATIVE
Bilirubin Urine: NEGATIVE
Glucose, UA: NEGATIVE
Glucose, UA: NEGATIVE
Hgb urine dipstick: NEGATIVE
Leukocytes, UA: NEGATIVE
Nitrite: NEGATIVE
Protein, ur: NEGATIVE
pH: 6

## 2010-12-13 LAB — PROTIME-INR: INR: 1

## 2010-12-13 LAB — URINE MICROSCOPIC-ADD ON

## 2014-08-02 ENCOUNTER — Telehealth: Payer: Self-pay | Admitting: Internal Medicine

## 2014-08-02 NOTE — Telephone Encounter (Signed)
Called and scheduled

## 2014-08-02 NOTE — Telephone Encounter (Signed)
yes

## 2014-08-02 NOTE — Telephone Encounter (Signed)
Patient was referred to you by a friend, and is requesting to be taken under your care. Is this ok with you?

## 2014-08-03 ENCOUNTER — Other Ambulatory Visit (INDEPENDENT_AMBULATORY_CARE_PROVIDER_SITE_OTHER): Payer: PRIVATE HEALTH INSURANCE

## 2014-08-03 ENCOUNTER — Ambulatory Visit (INDEPENDENT_AMBULATORY_CARE_PROVIDER_SITE_OTHER): Payer: PRIVATE HEALTH INSURANCE | Admitting: Internal Medicine

## 2014-08-03 ENCOUNTER — Encounter: Payer: Self-pay | Admitting: Internal Medicine

## 2014-08-03 VITALS — BP 120/82 | HR 93 | Temp 98.3°F | Resp 16 | Ht 72.0 in | Wt 209.0 lb

## 2014-08-03 DIAGNOSIS — R7989 Other specified abnormal findings of blood chemistry: Secondary | ICD-10-CM | POA: Diagnosis not present

## 2014-08-03 DIAGNOSIS — Z0000 Encounter for general adult medical examination without abnormal findings: Secondary | ICD-10-CM | POA: Diagnosis not present

## 2014-08-03 DIAGNOSIS — Z23 Encounter for immunization: Secondary | ICD-10-CM

## 2014-08-03 DIAGNOSIS — R0683 Snoring: Secondary | ICD-10-CM | POA: Diagnosis not present

## 2014-08-03 DIAGNOSIS — Z0001 Encounter for general adult medical examination with abnormal findings: Secondary | ICD-10-CM | POA: Diagnosis not present

## 2014-08-03 DIAGNOSIS — Z136 Encounter for screening for cardiovascular disorders: Secondary | ICD-10-CM | POA: Insufficient documentation

## 2014-08-03 DIAGNOSIS — G4733 Obstructive sleep apnea (adult) (pediatric): Secondary | ICD-10-CM | POA: Insufficient documentation

## 2014-08-03 DIAGNOSIS — Z9989 Dependence on other enabling machines and devices: Secondary | ICD-10-CM

## 2014-08-03 LAB — PSA: PSA: 0.82 ng/mL (ref 0.10–4.00)

## 2014-08-03 LAB — COMPREHENSIVE METABOLIC PANEL
ALK PHOS: 105 U/L (ref 39–117)
ALT: 24 U/L (ref 0–53)
AST: 16 U/L (ref 0–37)
Albumin: 4.4 g/dL (ref 3.5–5.2)
BILIRUBIN TOTAL: 0.5 mg/dL (ref 0.2–1.2)
BUN: 16 mg/dL (ref 6–23)
CALCIUM: 9.4 mg/dL (ref 8.4–10.5)
CO2: 28 meq/L (ref 19–32)
Chloride: 103 mEq/L (ref 96–112)
Creatinine, Ser: 1.01 mg/dL (ref 0.40–1.50)
GFR: 86.25 mL/min (ref >60.00)
Glucose, Bld: 108 mg/dL — ABNORMAL HIGH (ref 70–99)
Potassium: 3.7 mEq/L (ref 3.5–5.1)
Sodium: 137 mEq/L (ref 135–145)
TOTAL PROTEIN: 7.1 g/dL (ref 6.0–8.3)

## 2014-08-03 LAB — CBC WITH DIFFERENTIAL/PLATELET
BASOS PCT: 0.6 % (ref 0.0–3.0)
Basophils Absolute: 0 10*3/uL (ref 0.0–0.1)
EOS ABS: 0.2 10*3/uL (ref 0.0–0.7)
EOS PCT: 2.5 % (ref 0.0–5.0)
HEMATOCRIT: 48.8 % (ref 39.0–52.0)
Hemoglobin: 16.4 g/dL (ref 13.0–17.0)
LYMPHS ABS: 2.1 10*3/uL (ref 0.7–4.0)
Lymphocytes Relative: 25.1 % (ref 12.0–46.0)
MCHC: 33.7 g/dL (ref 30.0–36.0)
MCV: 82 fl (ref 78.0–100.0)
MONO ABS: 0.6 10*3/uL (ref 0.1–1.0)
Monocytes Relative: 7.1 % (ref 3.0–12.0)
NEUTROS ABS: 5.3 10*3/uL (ref 1.4–7.7)
NEUTROS PCT: 64.7 % (ref 43.0–77.0)
Platelets: 263 10*3/uL (ref 150.0–400.0)
RBC: 5.94 Mil/uL — ABNORMAL HIGH (ref 4.22–5.81)
RDW: 13.6 % (ref 11.5–15.5)
WBC: 8.2 10*3/uL (ref 4.0–10.5)

## 2014-08-03 LAB — LDL CHOLESTEROL, DIRECT: Direct LDL: 159 mg/dL

## 2014-08-03 LAB — LIPID PANEL
CHOL/HDL RATIO: 6
CHOLESTEROL: 235 mg/dL — AB (ref 0–200)
HDL: 36.8 mg/dL — ABNORMAL LOW (ref >39.00)
NONHDL: 198.2
Triglycerides: 230 mg/dL — ABNORMAL HIGH (ref 0.0–149.0)
VLDL: 46 mg/dL — ABNORMAL HIGH (ref 0.0–40.0)

## 2014-08-03 LAB — TSH: TSH: 1.16 u[IU]/mL (ref 0.35–4.50)

## 2014-08-03 NOTE — Patient Instructions (Signed)

## 2014-08-03 NOTE — Progress Notes (Signed)
Pre visit review using our clinic review tool, if applicable. No additional management support is needed unless otherwise documented below in the visit note. 

## 2014-08-03 NOTE — Progress Notes (Signed)
Subjective:  Patient ID: Connor Tate, male    DOB: 1970-06-18  Age: 44 y.o. MRN: [id number]  CC: Annual Exam   HPI Connor Tate presents for a CPX - he complains of snoring, chronic daily headache for many years, fatigue, and weight gain.   History Connor Tate has no past medical history on file.   He has no past surgical history on file.   His family history includes Heart disease in his mother. There is no history of Alcohol abuse, Cancer, COPD, Depression, Diabetes, Drug abuse, Early death, Hearing loss, Hyperlipidemia, Hypertension, Kidney disease, or Stroke.He reports that he has never smoked. He has never used smokeless tobacco. He reports that he drinks about 0.6 oz of alcohol per week. He reports that he does not use illicit drugs.  No outpatient prescriptions prior to visit.   No facility-administered medications prior to visit.    ROS Review of Systems  Constitutional: Negative.  Negative for fever, chills, diaphoresis, appetite change and fatigue.  HENT: Negative.   Eyes: Negative.   Respiratory: Negative.  Negative for cough, choking, chest tightness, shortness of breath and stridor.   Cardiovascular: Negative.  Negative for chest pain, palpitations and leg swelling.  Gastrointestinal: Negative.  Negative for nausea, abdominal pain, diarrhea and blood in stool.  Endocrine: Negative.   Genitourinary: Negative.   Musculoskeletal: Negative.   Skin: Negative.   Allergic/Immunologic: Negative.   Neurological: Negative.  Negative for dizziness, syncope, speech difficulty, light-headedness, numbness and headaches.  Hematological: Negative.  Negative for adenopathy. Does not bruise/bleed easily.  Psychiatric/Behavioral: Positive for sleep disturbance. Negative for suicidal ideas, hallucinations, behavioral problems, confusion, self-injury, dysphoric mood, decreased concentration and agitation. The patient is not nervous/anxious and is not hyperactive.     Objective:    BP 120/82 mmHg  Pulse 93  Temp(Src) 98.3 F (36.8 C) (Oral)  Resp 16  Ht 6' (1.829 m)  Wt 209 lb (94.802 kg)  BMI 28.34 kg/m2  SpO2 96%  Physical Exam  Constitutional: He is oriented to person, place, and time. He appears well-developed and well-nourished. No distress.  HENT:  Head: Normocephalic and atraumatic.  Mouth/Throat: Oropharynx is clear and moist. No oropharyngeal exudate.  Eyes: Conjunctivae are normal. Right eye exhibits no discharge. Left eye exhibits no discharge. No scleral icterus.  Neck: Normal range of motion. Neck supple. No JVD present. No tracheal deviation present. No thyromegaly present.  Cardiovascular: Normal rate, regular rhythm, normal heart sounds and intact distal pulses.  Exam reveals no gallop and no friction rub.   No murmur heard. EKG today  Sinus  Rhythm  -Right bundle branch block.   -consider old inferior infarct.   ABNORMAL    Pulmonary/Chest: Effort normal and breath sounds normal. No stridor. No respiratory distress. He has no wheezes. He has no rales. He exhibits no tenderness.  Abdominal: Soft. Bowel sounds are normal. He exhibits no distension and no mass. There is no tenderness. There is no rebound and no guarding. Hernia confirmed negative in the right inguinal area and confirmed negative in the left inguinal area.  Genitourinary: Testes normal and penis normal. Right testis shows no mass, no swelling and no tenderness. Right testis is descended. Left testis shows no mass, no swelling and no tenderness. Left testis is descended. Circumcised. No penile erythema or penile tenderness. No discharge found.  Musculoskeletal: Normal range of motion. He exhibits no edema or tenderness.  Lymphadenopathy:    He has no cervical adenopathy.  Right: No inguinal adenopathy present.       Left: No inguinal adenopathy present.  Neurological: He is oriented to person, place, and time.  Skin: Skin is warm and dry. No rash noted. He is not  diaphoretic. No erythema. No pallor.  Psychiatric: He has a normal mood and affect. His behavior is normal. Judgment and thought content normal.  Vitals reviewed.     Assessment & Plan:   Connor Tate was seen today for annual exam.  Diagnoses and all orders for this visit:  Routine general medical examination at a health care facility - exam done, labs ordered, vaccines were updated, will refer for sleep evaluation Orders: -     Lipid panel; Future -     Comprehensive metabolic panel; Future -     CBC with Differential/Platelet; Future -     TSH; Future -     PSA; Future  Snoring Orders: -     Ambulatory referral to Pulmonology  Screening, ischemic heart disease - he has a benign-appearing RBBB no his EKG and no suspicious s/s Orders: -     EKG 12-Lead  Mr. Spychalski does not currently have medications on file.  No orders of the defined types were placed in this encounter.     Follow-up: Return in about 3 months (around 11/03/2014).  Scarlette Calico, MD

## 2014-08-04 NOTE — Addendum Note (Signed)
Addended by: Estell Harpin T on: 08/04/2014 08:30 AM   Modules accepted: Orders, SmartSet

## 2015-08-28 ENCOUNTER — Ambulatory Visit (INDEPENDENT_AMBULATORY_CARE_PROVIDER_SITE_OTHER): Payer: PRIVATE HEALTH INSURANCE | Admitting: Internal Medicine

## 2015-08-28 ENCOUNTER — Other Ambulatory Visit (INDEPENDENT_AMBULATORY_CARE_PROVIDER_SITE_OTHER): Payer: PRIVATE HEALTH INSURANCE

## 2015-08-28 ENCOUNTER — Encounter: Payer: Self-pay | Admitting: Internal Medicine

## 2015-08-28 VITALS — BP 136/96 | HR 84 | Temp 98.4°F | Resp 16 | Ht 72.0 in | Wt 212.0 lb

## 2015-08-28 DIAGNOSIS — I10 Essential (primary) hypertension: Secondary | ICD-10-CM | POA: Insufficient documentation

## 2015-08-28 DIAGNOSIS — G4700 Insomnia, unspecified: Secondary | ICD-10-CM

## 2015-08-28 DIAGNOSIS — I152 Hypertension secondary to endocrine disorders: Secondary | ICD-10-CM | POA: Insufficient documentation

## 2015-08-28 DIAGNOSIS — G4730 Sleep apnea, unspecified: Secondary | ICD-10-CM

## 2015-08-28 DIAGNOSIS — R7989 Other specified abnormal findings of blood chemistry: Secondary | ICD-10-CM

## 2015-08-28 DIAGNOSIS — R739 Hyperglycemia, unspecified: Secondary | ICD-10-CM

## 2015-08-28 DIAGNOSIS — Z0000 Encounter for general adult medical examination without abnormal findings: Secondary | ICD-10-CM

## 2015-08-28 DIAGNOSIS — E119 Type 2 diabetes mellitus without complications: Secondary | ICD-10-CM | POA: Insufficient documentation

## 2015-08-28 DIAGNOSIS — G4733 Obstructive sleep apnea (adult) (pediatric): Secondary | ICD-10-CM

## 2015-08-28 DIAGNOSIS — E1169 Type 2 diabetes mellitus with other specified complication: Secondary | ICD-10-CM | POA: Insufficient documentation

## 2015-08-28 DIAGNOSIS — I456 Pre-excitation syndrome: Secondary | ICD-10-CM | POA: Diagnosis not present

## 2015-08-28 LAB — HEMOGLOBIN A1C: Hgb A1c MFr Bld: 6.4 % (ref 4.6–6.5)

## 2015-08-28 LAB — COMPREHENSIVE METABOLIC PANEL
ALBUMIN: 4.4 g/dL (ref 3.5–5.2)
ALK PHOS: 110 U/L (ref 39–117)
ALT: 20 U/L (ref 0–53)
AST: 14 U/L (ref 0–37)
BILIRUBIN TOTAL: 0.4 mg/dL (ref 0.2–1.2)
BUN: 16 mg/dL (ref 6–23)
CO2: 29 mEq/L (ref 19–32)
Calcium: 9.7 mg/dL (ref 8.4–10.5)
Chloride: 103 mEq/L (ref 96–112)
Creatinine, Ser: 1 mg/dL (ref 0.40–1.50)
GFR: 86.8 mL/min (ref >60.00)
GLUCOSE: 101 mg/dL — AB (ref 70–99)
POTASSIUM: 3.9 meq/L (ref 3.5–5.1)
SODIUM: 136 meq/L (ref 135–145)
TOTAL PROTEIN: 7.7 g/dL (ref 6.0–8.3)

## 2015-08-28 LAB — CBC WITH DIFFERENTIAL/PLATELET
BASOS ABS: 0.1 10*3/uL (ref 0.0–0.1)
Basophils Relative: 0.6 % (ref 0.0–3.0)
EOS ABS: 0.2 10*3/uL (ref 0.0–0.7)
Eosinophils Relative: 2.9 % (ref 0.0–5.0)
HCT: 49.8 % (ref 39.0–52.0)
HEMOGLOBIN: 16.6 g/dL (ref 13.0–17.0)
LYMPHS ABS: 2.2 10*3/uL (ref 0.7–4.0)
Lymphocytes Relative: 27 % (ref 12.0–46.0)
MCHC: 33.4 g/dL (ref 30.0–36.0)
MCV: 83.5 fl (ref 78.0–100.0)
MONO ABS: 0.5 10*3/uL (ref 0.1–1.0)
Monocytes Relative: 6.5 % (ref 3.0–12.0)
NEUTROS PCT: 63 % (ref 43.0–77.0)
Neutro Abs: 5 10*3/uL (ref 1.4–7.7)
Platelets: 271 10*3/uL (ref 150.0–400.0)
RBC: 5.97 Mil/uL — AB (ref 4.22–5.81)
RDW: 13.6 % (ref 11.5–15.5)
WBC: 8 10*3/uL (ref 4.0–10.5)

## 2015-08-28 LAB — TSH: TSH: 1.32 u[IU]/mL (ref 0.35–4.50)

## 2015-08-28 LAB — LIPID PANEL
CHOLESTEROL: 232 mg/dL — AB (ref 0–200)
HDL: 39.8 mg/dL (ref >39.00)
NonHDL: 192.06
TRIGLYCERIDES: 223 mg/dL — AB (ref 0.0–149.0)
Total CHOL/HDL Ratio: 6
VLDL: 44.6 mg/dL — AB (ref 0.0–40.0)

## 2015-08-28 LAB — LDL CHOLESTEROL, DIRECT: LDL DIRECT: 164 mg/dL

## 2015-08-28 MED ORDER — AZILSARTAN MEDOXOMIL 80 MG PO TABS: 1.0000 | ORAL_TABLET | Freq: Every day | ORAL | Status: DC

## 2015-08-28 MED ORDER — ESZOPICLONE 2 MG PO TABS: 2.0000 mg | ORAL_TABLET | Freq: Every evening | ORAL | Status: DC | PRN

## 2015-08-28 NOTE — Patient Instructions (Signed)

## 2015-08-28 NOTE — Progress Notes (Signed)
Pre visit review using our clinic review tool, if applicable. No additional management support is needed unless otherwise documented below in the visit note. 

## 2015-08-28 NOTE — Progress Notes (Signed)
Subjective:  Patient ID: Connor Tate, male    DOB: Jul 30, 1970  Age: 45 y.o. MRN: [id number]  CC: Hyperlipidemia; Annual Exam; and Hypertension   HPI Connor Tate presents for a CPX.  Since I last saw him he complains that he has gained weight, his snoring has worsened and he thinks he is having episodes of gasping at night, he has severe insomnia and fatigue during the day. He denies headache, blurred vision, chest pain, or edema. He has noticed over the last year some dyspnea on exertion. He describes having a syncopal episode about 10 years ago but his had no syncope or near syncope since then. He had a cardiac catheterization at that time that he says was normal. He denies any recent palpitations.  No outpatient prescriptions prior to visit.   No facility-administered medications prior to visit.    ROS Review of Systems  Constitutional: Positive for fatigue and unexpected weight change. Negative for fever, chills, diaphoresis, activity change and appetite change.  HENT: Negative.  Negative for sinus pressure and trouble swallowing.   Eyes: Negative.  Negative for visual disturbance.  Respiratory: Positive for apnea and shortness of breath.   Cardiovascular: Negative.  Negative for chest pain, palpitations and leg swelling.  Gastrointestinal: Negative.  Negative for abdominal pain.  Endocrine: Negative.   Genitourinary: Negative.  Negative for dysuria, hematuria, scrotal swelling, difficulty urinating and testicular pain.  Musculoskeletal: Negative.   Skin: Negative.   Allergic/Immunologic: Negative.   Neurological: Negative.  Negative for dizziness, tremors, syncope, speech difficulty, weakness, light-headedness, numbness and headaches.  Hematological: Negative.  Negative for adenopathy. Does not bruise/bleed easily.  Psychiatric/Behavioral: Positive for sleep disturbance. Negative for suicidal ideas, confusion, decreased concentration and agitation. The patient is not  nervous/anxious.     Objective:  BP 136/96 mmHg  Pulse 84  Temp(Src) 98.4 F (36.9 C) (Oral)  Resp 16  Ht 6' (1.829 m)  Wt 212 lb (96.163 kg)  BMI 28.75 kg/m2  SpO2 98%  BP Readings from Last 3 Encounters:  08/28/15 136/96  08/03/14 120/82    Wt Readings from Last 3 Encounters:  08/28/15 212 lb (96.163 kg)  08/03/14 209 lb (94.802 kg)    Physical Exam  Constitutional: He is oriented to person, place, and time. He appears well-developed and well-nourished. No distress.  HENT:  Head: Normocephalic and atraumatic.  Mouth/Throat: Oropharynx is clear and moist. No oropharyngeal exudate.  Eyes: Conjunctivae are normal. Right eye exhibits no discharge. Left eye exhibits no discharge. No scleral icterus.  Neck: Normal range of motion. Neck supple. No JVD present. No tracheal deviation present. No thyromegaly present.  Cardiovascular: Normal rate, regular rhythm, normal heart sounds and intact distal pulses.  Exam reveals no gallop and no friction rub.   No murmur heard. EKG ---  Sinus  Rhythm  -WPW pattern  (Type A).   ABNORMAL - this is new compared to his prior EKG  Pulmonary/Chest: Effort normal and breath sounds normal. No stridor. No respiratory distress. He has no wheezes. He has no rales. He exhibits no tenderness.  Abdominal: Soft. Bowel sounds are normal. He exhibits no distension and no mass. There is no tenderness. There is no rebound and no guarding. Hernia confirmed negative in the right inguinal area and confirmed negative in the left inguinal area.  Genitourinary: Testes normal and penis normal. Right testis shows no mass, no swelling and no tenderness. Right testis is descended. Left testis shows no mass, no swelling and no tenderness. Left  testis is descended. Circumcised. No penile erythema or penile tenderness. No discharge found.  Musculoskeletal: Normal range of motion. He exhibits no edema or tenderness.  Lymphadenopathy:    He has no cervical adenopathy.        Right: No inguinal adenopathy present.       Left: No inguinal adenopathy present.  Neurological: He is oriented to person, place, and time.  Skin: Skin is warm and dry. No rash noted. He is not diaphoretic. No erythema. No pallor.  Vitals reviewed.   Lab Results  Component Value Date   WBC 8.0 08/28/2015   HGB 16.6 08/28/2015   HCT 49.8 08/28/2015   PLT 271.0 08/28/2015   GLUCOSE 101* 08/28/2015   CHOL 232* 08/28/2015   TRIG 223.0* 08/28/2015   HDL 39.80 08/28/2015   LDLDIRECT 164.0 08/28/2015   ALT 20 08/28/2015   AST 14 08/28/2015   NA 136 08/28/2015   K 3.9 08/28/2015   CL 103 08/28/2015   CREATININE 1.00 08/28/2015   BUN 16 08/28/2015   CO2 29 08/28/2015   TSH 1.32 08/28/2015   PSA 0.82 08/03/2014   INR 1.0 10/22/2006   HGBA1C 6.4 08/28/2015    No results found.  Assessment & Plan:   Rad was seen today for hyperlipidemia, annual exam and hypertension.  Diagnoses and all orders for this visit:  Routine general medical examination at a health care facility- exam completed, labs ordered and reviewed, vaccines reviewed, patient education material was given. -     Comprehensive metabolic panel; Future -     CBC with Differential/Platelet; Future -     TSH; Future -     Lipid panel; Future  Hyperglycemia- his A1c is up to 6.4%, he is prediabetic, when I see him for follow-up will discuss starting metformin. -     Hemoglobin A1c; Future  Sleep apnea in adult- I am very concerned that he has sleep apnea in light of his symptoms as well as the development of hypertension and prediabetes, I've asked him to see sleep medicine to consider having a sleep study -     Ambulatory referral to Pulmonology  Essential hypertension- will start an ARB to control his blood pressure. -     EKG 12-Lead -     Azilsartan Medoxomil (EDARBI) 80 MG TABS; Take 1 tablet (80 mg total) by mouth daily.  Wolff-Parkinson-White (WPW) syndrome, type A- his EKG from a year ago showed right  bundle but now there is preexcitation consistent with WPW, I've asked him to see cardiology for further evaluation of this. -     Ambulatory referral to Cardiology  Insomnia with sleep apnea -     eszopiclone (LUNESTA) 2 MG TABS tablet; Take 1 tablet (2 mg total) by mouth at bedtime as needed for sleep. Take immediately before bedtime   I am having Mr. Jeannie Done start on Azilsartan Medoxomil and eszopiclone.  Meds ordered this encounter  Medications  . Azilsartan Medoxomil (EDARBI) 80 MG TABS    Sig: Take 1 tablet (80 mg total) by mouth daily.    Dispense:  70 tablet    Refill:  0  . eszopiclone (LUNESTA) 2 MG TABS tablet    Sig: Take 1 tablet (2 mg total) by mouth at bedtime as needed for sleep. Take immediately before bedtime    Dispense:  30 tablet    Refill:  5     Follow-up: Return in about 6 weeks (around 10/09/2015).  Scarlette Calico, MD

## 2015-08-29 ENCOUNTER — Encounter: Payer: Self-pay | Admitting: Internal Medicine

## 2015-09-10 ENCOUNTER — Encounter: Payer: Self-pay | Admitting: Internal Medicine

## 2015-09-18 ENCOUNTER — Other Ambulatory Visit: Payer: Self-pay | Admitting: Internal Medicine

## 2015-09-27 ENCOUNTER — Encounter: Payer: Self-pay | Admitting: Internal Medicine

## 2015-09-27 DIAGNOSIS — R0683 Snoring: Secondary | ICD-10-CM | POA: Insufficient documentation

## 2015-10-16 ENCOUNTER — Ambulatory Visit (INDEPENDENT_AMBULATORY_CARE_PROVIDER_SITE_OTHER): Payer: PRIVATE HEALTH INSURANCE | Admitting: Internal Medicine

## 2015-10-16 ENCOUNTER — Encounter: Payer: Self-pay | Admitting: Internal Medicine

## 2015-10-16 VITALS — BP 120/82 | HR 82 | Ht 72.0 in | Wt 213.0 lb

## 2015-10-16 DIAGNOSIS — I456 Pre-excitation syndrome: Secondary | ICD-10-CM | POA: Diagnosis not present

## 2015-10-16 NOTE — Patient Instructions (Signed)
Medication Instructions:  Your physician recommends that you continue on your current medications as directed. Please refer to the Current Medication list given to you today.   Labwork: None ordered   Testing/Procedures: Your physician has recommended that you have an ablation. Catheter ablation is a medical procedure used to treat some cardiac arrhythmias (irregular heartbeats). During catheter ablation, a long, thin, flexible tube is put into a blood vessel in your groin (upper thigh), or neck. This tube is called an ablation catheter. It is then guided to your heart through the blood vessel. Radio frequency waves destroy small areas of heart tissue where abnormal heartbeats may cause an arrhythmia to start. Please see the instruction sheet given to you today.  Call if you decide to proceed  Dates for procedure are 8/28,8/30,9/13,9/20,9/25  Follow-Up: Your physician recommends that you schedule a follow-up appointment as needed or call and schedule another appointment   Any Other Special Instructions Will Be Listed Below (If Applicable).     If you need a refill on your cardiac medications before your next appointment, please call your pharmacy.

## 2015-10-16 NOTE — Progress Notes (Signed)
HPI Mr. Connor Tate is referred today for evaluation of a WPW pattern on his ECG. He was evaluated about 12 years ago when he presented with severe chest pain and underwent heart catheterization which demonstrated no CAD. He was never told why his troponin was elevated. The patient had only brief episodes that he characterizes as sudden chest pressure and sob. No palpitations. He has never had documented SVT but he has very clear cut pre-excitation from a left sided pathway.   Allergies  Allergen Reactions  . Sulfa Antibiotics     Itchy and burning palms     Current Outpatient Prescriptions  Medication Sig Dispense Refill  . Azilsartan Medoxomil (EDARBI) 80 MG TABS Take 1 tablet (80 mg total) by mouth daily. 70 tablet 0  . eszopiclone (LUNESTA) 2 MG TABS tablet Take 1 tablet (2 mg total) by mouth at bedtime as needed for sleep. Take immediately before bedtime (Patient not taking: Reported on 10/16/2015) 30 tablet 5   No current facility-administered medications for this visit.      Past Medical History:  Diagnosis Date  . Atypical chest pain   . L4-L5 disc bulge   . Right bundle branch block   . Snoring     ROS:   All systems reviewed and negative except as noted in the HPI.   Past Surgical History:  Procedure Laterality Date  . NO PAST SURGERIES       Family History  Problem Relation Age of Onset  . Heart disease Mother   . Alcohol abuse Neg Hx   . Cancer Neg Hx   . COPD Neg Hx   . Depression Neg Hx   . Diabetes Neg Hx   . Drug abuse Neg Hx   . Early death Neg Hx   . Hearing loss Neg Hx   . Hyperlipidemia Neg Hx   . Hypertension Neg Hx   . Kidney disease Neg Hx   . Stroke Neg Hx      Social History   Social History  . Marital status: Married    Spouse name: N/A  . Number of children: N/A  . Years of education: N/A   Occupational History  . Not on file.   Social History Main Topics  . Smoking status: Never Smoker  . Smokeless tobacco: Never  Used  . Alcohol use 0.6 oz/week    1 Cans of beer per week  . Drug use: No  . Sexual activity: Yes    Partners: Female   Other Topics Concern  . Not on file   Social History Narrative  . No narrative on file     BP 120/82   Pulse 82   Ht 6' (1.829 m)   Wt 213 lb (96.6 kg)   BMI 28.89 kg/m   Physical Exam:  Well appearing 45 yo man, NAD HEENT: Unremarkable Neck:  6 cm JVD, no thyromegally Lymphatics:  No adenopathy Back:  No CVA tenderness Lungs:  Clear with no wheezes HEART:  Regular rate rhythm, no murmurs, no rubs, no clicks Abd:  soft, positive bowel sounds, no organomegally, no rebound, no guarding Ext:  2 plus pulses, no edema, no cyanosis, no clubbing Skin:  No rashes no nodules Neuro:  CN II through XII intact, motor grossly intact  EKG - NSR with WPW syndrome  Assess/Plan: 1. WPW syndrome - the patient's symptoms are not palpitations but chest pressure and sob. He is markedly pre-excited. I discussed the treatment options and  have recommended catheter ablation. He will call us if he wishes to proceed. 2. HTN - he will continue [employer].D.

## 2015-11-21 ENCOUNTER — Encounter (INDEPENDENT_AMBULATORY_CARE_PROVIDER_SITE_OTHER): Payer: Self-pay

## 2015-11-21 ENCOUNTER — Ambulatory Visit (INDEPENDENT_AMBULATORY_CARE_PROVIDER_SITE_OTHER): Payer: PRIVATE HEALTH INSURANCE | Admitting: Pulmonary Disease

## 2015-11-21 ENCOUNTER — Encounter: Payer: Self-pay | Admitting: Pulmonary Disease

## 2015-11-21 VITALS — BP 148/98 | HR 81 | Ht 72.0 in | Wt 213.8 lb

## 2015-11-21 DIAGNOSIS — G4730 Sleep apnea, unspecified: Secondary | ICD-10-CM

## 2015-11-21 DIAGNOSIS — R0683 Snoring: Secondary | ICD-10-CM

## 2015-11-21 DIAGNOSIS — Z23 Encounter for immunization: Secondary | ICD-10-CM | POA: Diagnosis not present

## 2015-11-21 DIAGNOSIS — G4733 Obstructive sleep apnea (adult) (pediatric): Secondary | ICD-10-CM | POA: Diagnosis not present

## 2015-11-21 NOTE — Patient Instructions (Signed)
Home sleep study We discussed treatment options for obstructive sleep apnea

## 2015-11-21 NOTE — Progress Notes (Signed)
Subjective:    Patient ID: Connor Tate, male    DOB: Apr 23, 1970, 45 y.o.   MRN: [id number]  HPI  Chief Complaint  Patient presents with  . Sleep Consult    Referred by Dr. 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 He reports loud snoring that has been noted by his wife regardless of body position. He also reports numerous nocturnal awakenings and non-refreshing sleep. Epworth sleepiness score is 11 but he denies problems driving. Bedtime is between 10:30 11:30 PM, the TV stays on during sleep onset but is on a timer, sleep latency is about 30-60 minutes, he sleeps on his side with one pillow, reports 15 or more nocturnal awakenings including one bathroom visit and is out of bed at 6:30 AM feeling tired with occasional dryness of mouth and headache.  There is no history suggestive of cataplexy, sleep paralysis or parasomnias  He used to train for triathlons but over the past few years has followed off the training schedule and is gained up to 10 pounds. He reports one to 2 glasses of iced tea and caffeinated soda. He reports occasional problems with nasal congestion and acid heartburn He denies gasping episodes or witnessed apneas  He has recently been diagnosed with Wolff-Parkinson-White syndrome and an ablation procedure is planned      Past Medical History:  Diagnosis Date  . Atypical chest pain   . L4-L5 disc bulge   . Right bundle branch block   . Snoring   . Wolff-Parkinson-White (WPW) syndrome      Past Surgical History:  Procedure Laterality Date  . NO PAST SURGERIES     Allergies  Allergen Reactions  . Sulfa Antibiotics     Itchy and burning palms    Social History   Social History  . Marital status: Married    Spouse name: N/A  . Number of children: N/A  . Years of education: N/A   Occupational History  . Not on file.   Social  History Main Topics  . Smoking status: Never Smoker  . Smokeless tobacco: Never Used  . Alcohol use 0.6 oz/week    1 Cans of beer per week  . Drug use: No  . Sexual activity: Yes    Partners: Female   Other Topics Concern  . Not on file   Social History Narrative  . No narrative on file    Family History  Problem Relation Age of Onset  . Heart disease Mother   . Alcohol abuse Neg Hx   . Cancer Neg Hx   . COPD Neg Hx   . Depression Neg Hx   . Diabetes Neg Hx   . Drug abuse Neg Hx   . Early death Neg Hx   . Hearing loss Neg Hx   . Hyperlipidemia Neg Hx   . Hypertension Neg Hx   . Kidney disease Neg Hx   . Stroke Neg Hx      Review of Systems neg for any significant sore throat, dysphagia, itching, sneezing, nasal congestion or excess/ purulent secretions, fever, chills, sweats, unintended wt loss, pleuritic or exertional cp, hempoptysis, orthopnea pnd or change in chronic leg swelling. Also denies presyncope, palpitations, heartburn, abdominal pain, nausea, vomiting, diarrhea or change in bowel or urinary habits, dysuria,hematuria, rash, arthralgias, visual complaints, headache, numbness weakness or ataxia.     Objective:   Physical Exam  Gen. Pleasant, well-nourished, in no distress ENT - no lesions, no post nasal drip Neck: No JVD, no thyromegaly, no carotid bruits Lungs: no use of accessory muscles, no dullness to percussion, clear without rales or rhonchi  Cardiovascular: Rhythm regular, heart sounds  normal, no murmurs or gallops, no peripheral edema Musculoskeletal: No deformities, no cyanosis or clubbing         Assessment & Plan:

## 2015-11-21 NOTE — Progress Notes (Signed)
   Subjective:    Patient ID: Connor Tate, male    DOB: April 13, 1970, 46 y.o.   MRN: [id number]  HPI    Review of Systems  Constitutional: Negative for activity change, appetite change, chills, fever and unexpected weight change.  HENT: Negative for congestion, dental problem, postnasal drip, rhinorrhea, sneezing, sore throat, trouble swallowing and voice change.   Eyes: Negative for visual disturbance.  Respiratory: Negative for cough, choking and shortness of breath.   Cardiovascular: Negative for chest pain and leg swelling.  Gastrointestinal: Negative for abdominal pain, nausea and vomiting.  Genitourinary: Negative for difficulty urinating.  Musculoskeletal: Negative for arthralgias.  Skin: Negative for rash.  Psychiatric/Behavioral: Negative for behavioral problems and confusion.       Objective:   Physical Exam        Assessment & Plan:

## 2015-11-22 NOTE — Assessment & Plan Note (Signed)
Given excessive daytime somnolence, narrow pharyngeal exam, witnessed apneas & loud snoring, obstructive sleep apnea is very likely & an overnight polysomnogram will be scheduled as a home study. The pathophysiology of obstructive sleep apnea , it's cardiovascular consequences & modes of treatment including CPAP were discused with the patient in detail & they evidenced understanding.  Pretest probability is intermediate  

## 2015-12-12 DIAGNOSIS — G4733 Obstructive sleep apnea (adult) (pediatric): Secondary | ICD-10-CM

## 2015-12-21 ENCOUNTER — Telehealth: Payer: Self-pay | Admitting: Pulmonary Disease

## 2015-12-21 DIAGNOSIS — G4733 Obstructive sleep apnea (adult) (pediatric): Secondary | ICD-10-CM

## 2015-12-21 NOTE — Telephone Encounter (Signed)
Per RA: Severe sleep apnea, increased in supine sleep.   Recommend cpap titration study  ---------------  lmtcb X1 for patient. Forwarding to [place] to follow up on.

## 2015-12-25 DIAGNOSIS — G4733 Obstructive sleep apnea (adult) (pediatric): Secondary | ICD-10-CM

## 2015-12-26 ENCOUNTER — Other Ambulatory Visit: Payer: Self-pay | Admitting: *Deleted

## 2015-12-26 DIAGNOSIS — R0683 Snoring: Secondary | ICD-10-CM

## 2015-12-28 NOTE — Telephone Encounter (Signed)
Spoke with pt. He is aware of his sleep study results. Order has been placed for the CPAP titration study. Nothing further was needed.

## 2015-12-31 ENCOUNTER — Telehealth: Payer: Self-pay | Admitting: Pulmonary Disease

## 2015-12-31 NOTE — Telephone Encounter (Signed)
Spoke with pt and advised of sleep study results per Dr Elsworth Soho

## 2016-02-18 LAB — HEMOGLOBIN A1C: HEMOGLOBIN A1C: 6.1

## 2016-02-19 ENCOUNTER — Ambulatory Visit (HOSPITAL_BASED_OUTPATIENT_CLINIC_OR_DEPARTMENT_OTHER): Payer: PRIVATE HEALTH INSURANCE | Attending: Pulmonary Disease | Admitting: Pulmonary Disease

## 2016-02-19 DIAGNOSIS — G4733 Obstructive sleep apnea (adult) (pediatric): Secondary | ICD-10-CM | POA: Insufficient documentation

## 2016-02-20 DIAGNOSIS — G4733 Obstructive sleep apnea (adult) (pediatric): Secondary | ICD-10-CM

## 2016-02-20 NOTE — Procedures (Signed)
Patient Name: Connor Tate, Connor Tate Date: 02/19/2016 Gender: Male D.O.B: September 25, 1970 Age (years): 21 Referring Provider: Kara Mead MD, ABSM Height (inches): 72 Interpreting Physician: Kara Mead MD, ABSM Weight (lbs): 215 RPSGT: Madelon Lips BMI: 29 MRN: [id number] Neck Size: 16.50   CLINICAL INFORMATION The patient is referred for a CPAP titration to treat sleep apnea.  Date of  HST: 12/2015,Moderate OSA, severe in supine position with AHI of 40/hour  SLEEP STUDY TECHNIQUE As per the AASM Manual for the Scoring of Sleep and Associated Events v2.3 (April 2016) with a hypopnea requiring 4% desaturations.  The channels recorded and monitored were frontal, central and occipital EEG, electrooculogram (EOG), submentalis EMG (chin), nasal and oral airflow, thoracic and abdominal wall motion, anterior tibialis EMG, snore microphone, electrocardiogram, and pulse oximetry. Continuous positive airway pressure (CPAP) was initiated at the beginning of the study and titrated to treat sleep-disordered breathing.  RESPIRATORY PARAMETERS Optimal PAP Pressure (cm): 9 AHI at Optimal Pressure (/hr): 0.0 Overall Minimal O2 (%): 89.00 Supine % at Optimal Pressure (%): 0 Minimal O2 at Optimal Pressure (%): 93.0     SLEEP ARCHITECTURE The study was initiated at 10:57:51 PM and ended at 5:37:05 AM.  Sleep onset time was 34.1 minutes and the sleep efficiency was 67.0%. The total sleep time was 267.7 minutes.  The patient spent 6.72% of the night in stage N1 sleep, 59.09% in stage N2 sleep, 0.00% in stage N3 and 34.18% in REM.Stage REM latency was 116.0 minutes  Wake after sleep onset was 97.5. Alpha intrusion was absent. Supine sleep was 0.75%.  CARDIAC DATA The 2 lead EKG demonstrated sinus rhythm. The mean heart rate was 59.94 beats per minute. Other EKG findings include: None. LEG MOVEMENT DATA The total Periodic Limb Movements of Sleep (PLMS) were 0. The PLMS index was 0.00. A PLMS index of <15  is considered normal in adults.  IMPRESSIONS - The optimal PAP pressure was 9 cm of water. - Central sleep apnea was not noted during this titration (CAI = 0.7/h). - Mild oxygen desaturations were observed during this titration (min O2 = 89.00%). - The patient snored with Moderate snoring volume during this titration study. - No cardiac abnormalities were observed during this study. - Clinically significant periodic limb movements were not noted during this study. Arousals associated with PLMs were rare.   DIAGNOSIS - Obstructive Sleep Apnea (327.23 [G47.33 ICD-10])   RECOMMENDATIONS - Trial of CPAP therapy on 9 cm H2O with a Medium size Fisher&Paykel Full Face Mask Simplus mask and heated humidification. - Avoid alcohol, sedatives and other CNS depressants that may worsen sleep apnea and disrupt normal sleep architecture. - Sleep hygiene should be reviewed to assess factors that may improve sleep quality. - Weight management and regular exercise should be initiated or continued. - Return to [facility] Center for re-evaluation after 4 weeks of therapy   Kara Mead MD Board Certified in [place]

## 2016-03-11 ENCOUNTER — Telehealth: Payer: Self-pay | Admitting: Pulmonary Disease

## 2016-03-11 DIAGNOSIS — G4733 Obstructive sleep apnea (adult) (pediatric): Secondary | ICD-10-CM

## 2016-03-11 NOTE — Telephone Encounter (Signed)
lmtcb X1 

## 2016-03-11 NOTE — Telephone Encounter (Signed)
OSA was corrected by CPAP Pl send Rx to DME for - CPAP therapy on 9 cm H2O with a Medium size Fisher&Paykel Full Face Mask Simplus mask and heated humidification - DL in 4 wks  OV with TP/ me in 6 wks

## 2016-03-11 NOTE — Telephone Encounter (Signed)
Pt is requesting sleep study results from 02-19-16. RA please advise. Thanks.

## 2016-03-11 NOTE — Telephone Encounter (Signed)
Patient returned call, CB is [phone].

## 2016-03-11 NOTE — Telephone Encounter (Signed)
Spoke with pt, aware of results/recs.  rov scheduled, cpap order placed.  Nothing further needed.

## 2016-04-07 ENCOUNTER — Telehealth: Payer: Self-pay | Admitting: Pulmonary Disease

## 2016-04-07 NOTE — Telephone Encounter (Signed)
Resent order to [facility] will wait to hear back from them Joellen Jersey

## 2016-04-07 NOTE — Telephone Encounter (Signed)
I called and spoke with Connor Tate at [facility] and she stated that they never did receive the order that was faxed to them by [facility] on 03/11/16.  She stated that if we could resend it she will get it done.  thanks

## 2016-04-08 NOTE — Telephone Encounter (Signed)
Heard back from [facility] they will ck into this and call pt Connor Tate

## 2016-04-08 NOTE — Telephone Encounter (Signed)
Spoke with pt. He is aware that we have resent his order to [facility] and they should be contacting him about this. Nothing further was needed.

## 2016-05-02 ENCOUNTER — Ambulatory Visit: Payer: PRIVATE HEALTH INSURANCE | Admitting: Adult Health

## 2016-05-26 ENCOUNTER — Encounter: Payer: Self-pay | Admitting: Adult Health

## 2016-05-26 ENCOUNTER — Ambulatory Visit (INDEPENDENT_AMBULATORY_CARE_PROVIDER_SITE_OTHER): Payer: PRIVATE HEALTH INSURANCE | Admitting: Adult Health

## 2016-05-26 DIAGNOSIS — G4730 Sleep apnea, unspecified: Secondary | ICD-10-CM | POA: Diagnosis not present

## 2016-05-26 NOTE — Assessment & Plan Note (Signed)
Severe OSA - needs better compliance with CPAP  Change to nasal pillows with chin strap  If not working may need to send for mask fitting .  Add saline nasal rinses and gel As needed    Plan  Patient Instructions  Try to wear CPAP each night for at least 4hr  Change to nasal pillows w/ chin strap .  Do not drive if sleepy .  follow up Dr. Elsworth Soho  In 3 months and As needed

## 2016-05-26 NOTE — Progress Notes (Signed)
@Patient  ID: Connor Tate, male    DOB: 10-Aug-1970, 46 y.o.   MRN: [id number]  Chief Complaint  Patient presents with  . Follow-up    OSA    Referring provider: Janith Lima, MD  HPI: 46 year old male seen for sleep consult September 2017 found to have severe sleep apnea  TEST  Sleep study 122017 >AHI 40/h CPAP titration >optimal control at 9cm H20  05/26/2016 Follow up : OSA  Patient presents for a six-month follow-up. Patient was seen in September for a sleep consult. He was set up for a sleep study that showed severe sleep apnea with AHI at 40/hr. he was recommended to undergo a C Pap titration study that showed optimal control at 9 cmH20.  Says that overall he is has been having trouble wearing machine, mask is not comfortable, has tried different masks. He has had a bad cold with cough and nasal congestion. Says cough is better feels he can try to wear more now .  Download shows 60% compliance with average usage at around 2 hours. AHI 12.8. Positive leaks. Still feels tired during daytime .   Allergies  Allergen Reactions  . Sulfa Antibiotics     Itchy and burning palms    Immunization History  Administered Date(s) Administered  . Influenza,inj,Quad PF,36+ Mos 11/21/2015  . Tdap 08/03/2014    Past Medical History:  Diagnosis Date  . Atypical chest pain   . L4-L5 disc bulge   . Right bundle branch block   . Snoring   . Wolff-Parkinson-White (WPW) syndrome     Tobacco History: History  Smoking Status  . Never Smoker  Smokeless Tobacco  . Never Used   Counseling given: Not Answered   Outpatient Encounter Prescriptions as of 05/26/2016  Medication Sig  . eszopiclone (LUNESTA) 2 MG TABS tablet Take 1 tablet (2 mg total) by mouth at bedtime as needed for sleep. Take immediately before bedtime (Patient not taking: Reported on 05/26/2016)   No facility-administered encounter medications on file as of 05/26/2016.      Review of Systems  Constitutional:    No  weight loss, night sweats,  Fevers, chills, fatigue, or  lassitude.  HEENT:   No headaches,  Difficulty swallowing,  Tooth/dental problems, or  Sore throat,                No sneezing, itching, ear ache,  +nasal congestion, post nasal drip,   CV:  No chest pain,  Orthopnea, PND, swelling in lower extremities, anasarca, dizziness, palpitations, syncope.   GI  No heartburn, indigestion, abdominal pain, nausea, vomiting, diarrhea, change in bowel habits, loss of appetite, bloody stools.   Resp: No shortness of breath with exertion or at rest.  No excess mucus, no productive cough,  No non-productive cough,  No coughing up of blood.  No change in color of mucus.  No wheezing.  No chest wall deformity  Skin: no rash or lesions.  GU: no dysuria, change in color of urine, no urgency or frequency.  No flank pain, no hematuria   MS:  No joint pain or swelling.  No decreased range of motion.  No back pain.    Physical Exam  BP 134/74 (BP Location: Right Arm, Cuff Size: Normal)   Pulse 89   Ht 6' (1.829 m)   Wt 220 lb (99.8 kg)   SpO2 97%   BMI 29.84 kg/m   GEN: A/Ox3; pleasant , NAD, well nourished    HEENT:  [place]/AT,  EACs-clear, TMs-wnl, NOSE-clear, THROAT-clear, no lesions, no postnasal drip or exudate noted. Class 2 MP airway with elongated uvula .   NECK:  Supple w/ fair ROM; no JVD; normal carotid impulses w/o bruits; no thyromegaly or nodules palpated; no lymphadenopathy.    RESP  Clear  P & A; w/o, wheezes/ rales/ or rhonchi. no accessory muscle use, no dullness to percussion  CARD:  RRR, no m/r/g, no peripheral edema, pulses intact, no cyanosis or clubbing.  GI:   Soft & nt; nml bowel sounds; no organomegaly or masses detected.   Musco: Warm bil, no deformities or joint swelling noted.   Neuro: alert, no focal deficits noted.    Skin: Warm, no lesions or rashes    BNP No results found for: BNP  ProBNP No results found for: PROBNP  Imaging: No results  found.   Assessment & Plan:   Sleep apnea in adult Severe OSA - needs better compliance with CPAP  Change to nasal pillows with chin strap  If not working may need to send for mask fitting .  Add saline nasal rinses and gel As needed    Plan  Patient Instructions  Try to wear CPAP each night for at least 4hr  Change to nasal pillows w/ chin strap .  Do not drive if sleepy .  follow up Dr. Elsworth Soho  In 3 months and As needed         Rexene Edison, NP 05/26/2016

## 2016-05-26 NOTE — Patient Instructions (Signed)
Try to wear CPAP each night for at least 4hr  Change to nasal pillows w/ chin strap .  Do not drive if sleepy .  follow up Dr. Elsworth Soho  In 3 months and As needed

## 2016-05-27 NOTE — Addendum Note (Signed)
Addended by: Parke Poisson E on: 05/27/2016 05:50 PM   Modules accepted: Orders

## 2016-06-05 NOTE — Progress Notes (Signed)
Reviewed & agree with plan  

## 2016-08-19 ENCOUNTER — Other Ambulatory Visit (INDEPENDENT_AMBULATORY_CARE_PROVIDER_SITE_OTHER): Payer: PRIVATE HEALTH INSURANCE

## 2016-08-19 ENCOUNTER — Ambulatory Visit (INDEPENDENT_AMBULATORY_CARE_PROVIDER_SITE_OTHER): Payer: PRIVATE HEALTH INSURANCE | Admitting: Internal Medicine

## 2016-08-19 ENCOUNTER — Encounter: Payer: Self-pay | Admitting: Internal Medicine

## 2016-08-19 VITALS — BP 118/84 | HR 83 | Temp 98.6°F | Ht 72.0 in | Wt 216.0 lb

## 2016-08-19 DIAGNOSIS — I456 Pre-excitation syndrome: Secondary | ICD-10-CM | POA: Diagnosis not present

## 2016-08-19 DIAGNOSIS — Z0000 Encounter for general adult medical examination without abnormal findings: Secondary | ICD-10-CM

## 2016-08-19 DIAGNOSIS — E118 Type 2 diabetes mellitus with unspecified complications: Secondary | ICD-10-CM

## 2016-08-19 LAB — COMPREHENSIVE METABOLIC PANEL
ALT: 24 U/L (ref 0–53)
AST: 16 U/L (ref 0–37)
Albumin: 4.4 g/dL (ref 3.5–5.2)
Alkaline Phosphatase: 99 U/L (ref 39–117)
BILIRUBIN TOTAL: 0.5 mg/dL (ref 0.2–1.2)
BUN: 15 mg/dL (ref 6–23)
CALCIUM: 9.6 mg/dL (ref 8.4–10.5)
CO2: 29 meq/L (ref 19–32)
Chloride: 103 mEq/L (ref 96–112)
Creatinine, Ser: 1.06 mg/dL (ref 0.40–1.50)
GFR: 80.79 mL/min (ref >60.00)
GLUCOSE: 127 mg/dL — AB (ref 70–99)
POTASSIUM: 3.9 meq/L (ref 3.5–5.1)
Sodium: 138 mEq/L (ref 135–145)
Total Protein: 7.2 g/dL (ref 6.0–8.3)

## 2016-08-19 LAB — CBC WITH DIFFERENTIAL/PLATELET
BASOS ABS: 0.1 10*3/uL (ref 0.0–0.1)
Basophils Relative: 0.8 % (ref 0.0–3.0)
EOS PCT: 2.5 % (ref 0.0–5.0)
Eosinophils Absolute: 0.2 10*3/uL (ref 0.0–0.7)
HEMATOCRIT: 49.6 % (ref 39.0–52.0)
Hemoglobin: 16.9 g/dL (ref 13.0–17.0)
LYMPHS ABS: 2.5 10*3/uL (ref 0.7–4.0)
Lymphocytes Relative: 33.8 % (ref 12.0–46.0)
MCHC: 34 g/dL (ref 30.0–36.0)
MCV: 83.6 fl (ref 78.0–100.0)
MONOS PCT: 7.4 % (ref 3.0–12.0)
Monocytes Absolute: 0.5 10*3/uL (ref 0.1–1.0)
NEUTROS ABS: 4.1 10*3/uL (ref 1.4–7.7)
NEUTROS PCT: 55.5 % (ref 43.0–77.0)
PLATELETS: 256 10*3/uL (ref 150.0–400.0)
RBC: 5.93 Mil/uL — AB (ref 4.22–5.81)
RDW: 12.9 % (ref 11.5–15.5)
WBC: 7.4 10*3/uL (ref 4.0–10.5)

## 2016-08-19 LAB — TSH: TSH: 2.08 u[IU]/mL (ref 0.35–4.50)

## 2016-08-19 LAB — LIPID PANEL
CHOL/HDL RATIO: 6
Cholesterol: 227 mg/dL — ABNORMAL HIGH (ref 0–200)
HDL: 38.6 mg/dL — AB (ref >39.00)
LDL CALC: 150 mg/dL — AB (ref 0–99)
NONHDL: 188.3
TRIGLYCERIDES: 191 mg/dL — AB (ref 0.0–149.0)
VLDL: 38.2 mg/dL (ref 0.0–40.0)

## 2016-08-19 LAB — HEMOGLOBIN A1C: Hgb A1c MFr Bld: 7 % — ABNORMAL HIGH (ref 4.6–6.5)

## 2016-08-19 LAB — MICROALBUMIN / CREATININE URINE RATIO
Creatinine,U: 193.2 mg/dL
MICROALB/CREAT RATIO: 0.6 mg/g (ref 0.0–30.0)
Microalb, Ur: 1.2 mg/dL (ref 0.0–1.9)

## 2016-08-19 LAB — HM DIABETES EYE EXAM

## 2016-08-19 NOTE — Patient Instructions (Signed)

## 2016-08-19 NOTE — Progress Notes (Signed)
Subjective:  Patient ID: Connor Tate, male    DOB: 1970-07-16  Age: 46 y.o. MRN: [id number]  CC: Annual Exam; Hypertension; and Diabetes   HPI Connor Tate presents for a CPX and f/up. He has been working on his lifestyle modifications to control his blood sugar and has been able to lose 4 pounds. He is using CPAP for sleep apnea and tells me that it is going well. He needs to have a follow-up with his cardiologist regarding the Wolff-Parkinson-White syndrome. Fortunately, over the last few months he's had no episodes of palpitations, dizziness, presyncope, or syncope. He feels well today and offers no complaints.  Outpatient Medications Prior to Visit  Medication Sig Dispense Refill  . eszopiclone (LUNESTA) 2 MG TABS tablet Take 1 tablet (2 mg total) by mouth at bedtime as needed for sleep. Take immediately before bedtime (Patient not taking: Reported on 05/26/2016) 30 tablet 5   No facility-administered medications prior to visit.     ROS Review of Systems  Constitutional: Negative.  Negative for diaphoresis and fatigue.  HENT: Negative.   Eyes: Negative.   Respiratory: Negative.  Negative for chest tightness.   Cardiovascular: Negative.  Negative for chest pain, palpitations and leg swelling.  Gastrointestinal: Negative.  Negative for abdominal pain, constipation, diarrhea, nausea and vomiting.  Genitourinary: Negative.  Negative for difficulty urinating, dysuria, penile swelling, scrotal swelling and testicular pain.  Musculoskeletal: Negative.   Skin: Negative.   Neurological: Negative.  Negative for dizziness and syncope.  Hematological: Negative for adenopathy. Does not bruise/bleed easily.  Psychiatric/Behavioral: Negative.     Objective:  BP 118/84 (BP Location: Left Arm, Patient Position: Sitting, Cuff Size: Large)   Pulse 83   Temp 98.6 F (37 C) (Oral)   Ht 6' (1.829 m)   Wt 216 lb (98 kg)   SpO2 100%   BMI 29.29 kg/m   BP Readings from Last 3  Encounters:  08/19/16 118/84  05/26/16 134/74  11/21/15 (!) 148/98    Wt Readings from Last 3 Encounters:  08/19/16 216 lb (98 kg)  05/26/16 220 lb (99.8 kg)  02/19/16 215 lb (97.5 kg)    Physical Exam  Constitutional: He is oriented to person, place, and time. No distress.  HENT:  Mouth/Throat: Oropharynx is clear and moist. No oropharyngeal exudate.  Eyes: Right eye exhibits no discharge. No scleral icterus.  Neck: Normal range of motion. Neck supple. No JVD present. No thyromegaly present.  Cardiovascular: Normal rate and regular rhythm.  Exam reveals no gallop.   No murmur heard. Pulmonary/Chest: Effort normal and breath sounds normal. No respiratory distress. He has no wheezes. He has no rales. He exhibits no tenderness.  Abdominal: Soft. Bowel sounds are normal. He exhibits no distension and no mass. There is no tenderness. There is no rebound and no guarding.  Musculoskeletal: Normal range of motion. He exhibits no edema, tenderness or deformity.  Lymphadenopathy:    He has no cervical adenopathy.  Neurological: He is alert and oriented to person, place, and time.  Skin: Skin is warm and dry. No rash noted. He is not diaphoretic. No erythema. No pallor.  Vitals reviewed.   Lab Results  Component Value Date   WBC 7.4 08/19/2016   HGB 16.9 08/19/2016   HCT 49.6 08/19/2016   PLT 256.0 08/19/2016   GLUCOSE 127 (H) 08/19/2016   CHOL 227 (H) 08/19/2016   TRIG 191.0 (H) 08/19/2016   HDL 38.60 (L) 08/19/2016   LDLDIRECT 164.0 08/28/2015  LDLCALC 150 (H) 08/19/2016   ALT 24 08/19/2016   AST 16 08/19/2016   NA 138 08/19/2016   K 3.9 08/19/2016   CL 103 08/19/2016   CREATININE 1.06 08/19/2016   BUN 15 08/19/2016   CO2 29 08/19/2016   TSH 2.08 08/19/2016   PSA 0.82 08/03/2014   INR 1.0 10/22/2006   HGBA1C 7.0 (H) 08/19/2016   MICROALBUR 1.2 08/19/2016    No results found.  Assessment & Plan:   Connor Tate was seen today for annual exam, hypertension and  diabetes.  Diagnoses and all orders for this visit:  Wolff-Parkinson-White (WPW) syndrome, type A- he has had no concerning symptoms recently but I did encourage him to follow-up with cardiology -     Ambulatory referral to Cardiology  Routine general medical examination at a health care facility- exam completed, labs ordered and reviewed, vaccines reviewed, patient education material was given. -     Lipid panel; Future -     Comprehensive metabolic panel; Future -     CBC with Differential/Platelet; Future -     TSH; Future  Type 2 diabetes mellitus with complication, without long-term current use of insulin ([place])- his A1c is up to 7% but he would rather control his blood sugar with lifestyle modifications than medications. He is not willing to take a statin for CV risk reduction, he has an eye exam scheduled for later today. -     Microalbumin / creatinine urine ratio; Future -     Hemoglobin A1c; Future   I have discontinued Connor Tate's eszopiclone.  No orders of the defined types were placed in this encounter.    Follow-up: Return in about 6 months (around 02/18/2017).  Scarlette Calico, MD

## 2016-08-21 ENCOUNTER — Encounter: Payer: Self-pay | Admitting: Internal Medicine

## 2016-09-01 ENCOUNTER — Encounter: Payer: PRIVATE HEALTH INSURANCE | Admitting: Internal Medicine

## 2016-09-01 ENCOUNTER — Institutional Professional Consult (permissible substitution): Payer: PRIVATE HEALTH INSURANCE | Admitting: Internal Medicine

## 2016-09-08 ENCOUNTER — Telehealth: Payer: Self-pay | Admitting: Pulmonary Disease

## 2016-09-08 DIAGNOSIS — G4733 Obstructive sleep apnea (adult) (pediatric): Secondary | ICD-10-CM

## 2016-09-08 NOTE — Telephone Encounter (Signed)
Spoke with patient when I called to reschedule his appt from 09/10/16 d/t a scheduling error.  Pt states that he has not been using CPAP machine d/t not having all the supplies available to him.  Pt states that his current DME [facility] has not been easy to work with and he wants to either get a new DME or have [place] talk to [facility] about getting his supplies asap.   Pt appt scheduled out until December 05, 2016 with RA.   Please advise Dr Elsworth Soho. Thanks.

## 2016-09-09 NOTE — Telephone Encounter (Signed)
Spoke with [facility]- Connor Tate states that her computer will not come up at the moment and she will have to call back.  FYI Pt is already scheduled with RA in October.

## 2016-09-09 NOTE — Telephone Encounter (Signed)
Pl get him supplies ASAP & schedule FU appt within 6-8 wks with NP for compliance check

## 2016-09-10 ENCOUNTER — Ambulatory Visit: Payer: PRIVATE HEALTH INSURANCE | Admitting: Pulmonary Disease

## 2016-10-22 NOTE — Telephone Encounter (Signed)
Order for supplies have been sent.

## 2016-12-05 ENCOUNTER — Encounter: Payer: Self-pay | Admitting: Pulmonary Disease

## 2016-12-05 ENCOUNTER — Ambulatory Visit (INDEPENDENT_AMBULATORY_CARE_PROVIDER_SITE_OTHER): Payer: PRIVATE HEALTH INSURANCE | Admitting: Pulmonary Disease

## 2016-12-05 DIAGNOSIS — G4733 Obstructive sleep apnea (adult) (pediatric): Secondary | ICD-10-CM | POA: Diagnosis not present

## 2016-12-05 DIAGNOSIS — Z9989 Dependence on other enabling machines and devices: Secondary | ICD-10-CM

## 2016-12-05 NOTE — Patient Instructions (Addendum)
Call [phone] to schedule appointment for mask fitting session  Based on this, we will send appropriate prescription to [place] to provide you with supplies Your CPAP is set at 9 cm

## 2016-12-05 NOTE — Addendum Note (Signed)
Addended by: Valerie Salts on: 12/05/2016 03:34 PM   Modules accepted: Orders

## 2016-12-05 NOTE — Assessment & Plan Note (Signed)
Call [phone] to schedule appointment for mask fitting session  Based on this, we will send appropriate prescription to [place] [facility] to provide you with supplies Your CPAP is set at 9 cm  Weight loss encouraged, compliance with goal of at least 4-6 hrs every night is the expectation. Advised against medications with sedative side effects Cautioned against driving when sleepy - understanding that sleepiness will vary on a day to day basis

## 2016-12-05 NOTE — Progress Notes (Signed)
   Subjective:    Patient ID: Connor Tate, male    DOB: 03-Nov-1970, 46 y.o.   MRN: [id number]  HPI  46 year old male for FU of severe sleep apnea  He was started on CPAP 9 cm with a full face mask in December 2017 seemed to do well for her. Of time and then developed mask issues, tried nasal mask but this did not work either. He was prescribed nasal pillows but was unable to get it due to insurance and DME issues. After that his wife was diagnosed with breast cancer. He stopped using CPAP altogether since he cannot get comfortable.  He now reports loud snoring and unrefreshing sleep and excessive daytime tiredness and sleepiness would like to get back on track    Significant tests/ events reviewed  Sleep study 02/2016 >AHI 40/h CPAP titration >optimal control at 9cm H20  Past Medical History:  Diagnosis Date  . Atypical chest pain   . L4-L5 disc bulge   . Right bundle branch block   . Snoring   . Wolff-Parkinson-White (WPW) syndrome       Review of Systems Patient denies significant dyspnea,cough, hemoptysis,  chest pain, palpitations, pedal edema, orthopnea, paroxysmal nocturnal dyspnea, lightheadedness, nausea, vomiting, abdominal or  leg pains      Objective:   Physical Exam  Gen. [place], well-nourished, in no distress ENT - no thrush, no post nasal drip Neck: No JVD, no thyromegaly, no carotid bruits Lungs: no use of accessory muscles, no dullness to percussion, clear without rales or rhonchi  Cardiovascular: Rhythm regular, heart sounds  normal, no murmurs or gallops, no peripheral edema Musculoskeletal: No deformities, no cyanosis or clubbing        Assessment & Plan:

## 2016-12-24 ENCOUNTER — Ambulatory Visit (HOSPITAL_BASED_OUTPATIENT_CLINIC_OR_DEPARTMENT_OTHER): Payer: PRIVATE HEALTH INSURANCE | Attending: Pulmonary Disease | Admitting: Radiology

## 2016-12-24 DIAGNOSIS — Z9989 Dependence on other enabling machines and devices: Principal | ICD-10-CM

## 2016-12-24 DIAGNOSIS — G4733 Obstructive sleep apnea (adult) (pediatric): Secondary | ICD-10-CM

## 2017-08-19 ENCOUNTER — Other Ambulatory Visit: Payer: Self-pay

## 2017-08-19 ENCOUNTER — Ambulatory Visit: Payer: PRIVATE HEALTH INSURANCE | Admitting: Emergency Medicine

## 2017-08-19 ENCOUNTER — Encounter: Payer: Self-pay | Admitting: Emergency Medicine

## 2017-08-19 VITALS — BP 124/80 | HR 86 | Temp 98.4°F | Resp 16 | Ht 71.0 in | Wt 214.0 lb

## 2017-08-19 DIAGNOSIS — Z0000 Encounter for general adult medical examination without abnormal findings: Secondary | ICD-10-CM | POA: Diagnosis not present

## 2017-08-19 NOTE — Patient Instructions (Addendum)
   IF you received an x-ray today, you will receive an invoice from [facility]. Please contact [facility] at [phone] with questions or concerns regarding your invoice.   IF you received labwork today, you will receive an invoice from [facility]. Please contact [facility] at [phone] with questions or concerns regarding your invoice.   Our billing staff will not be able to assist you with questions regarding bills from these companies.  You will be contacted with the lab results as soon as they are available. The fastest way to get your results is to activate your My Chart account. Instructions are located on the last page of this paperwork. If you have not heard from us regarding the results in 2 weeks, please contact this office.      Health Maintenance, Male A healthy lifestyle and preventive care is important for your health and wellness. Ask your health care provider about what schedule of regular examinations is right for you. What should I know about weight and diet? Eat a Healthy Diet  Eat plenty of vegetables, fruits, whole grains, low-fat dairy products, and lean protein.  Do not eat a lot of foods high in solid fats, added sugars, or salt.  Maintain a Healthy Weight Regular exercise can help you achieve or maintain a healthy weight. You should:  Do at least 150 minutes of exercise each week. The exercise should increase your heart rate and make you sweat (moderate-intensity exercise).  Do strength-training exercises at least twice a week.  Watch Your Levels of Cholesterol and Blood Lipids  Have your blood tested for lipids and cholesterol every 5 years starting at 45 years of age. If you are at high risk for heart disease, you should start having your blood tested when you are 45 years old. You may need to have your cholesterol levels checked more often if: ? Your lipid or cholesterol levels are high. ? You are older than 45 years of age. ? You  are at high risk for heart disease.  What should I know about cancer screening? Many types of cancers can be detected early and may often be prevented. Lung Cancer  You should be screened every year for lung cancer if: ? You are a current smoker who has smoked for at least 30 years. ? You are a former smoker who has quit within the past 15 years.  Talk to your health care provider about your screening options, when you should start screening, and how often you should be screened.  Colorectal Cancer  Routine colorectal cancer screening usually begins at 45 years of age and should be repeated every 5-10 years until you are 45 years old. You may need to be screened more often if early forms of precancerous polyps or small growths are found. Your health care provider may recommend screening at an earlier age if you have risk factors for colon cancer.  Your health care provider may recommend using home test kits to check for hidden blood in the stool.  A small camera at the end of a tube can be used to examine your colon (sigmoidoscopy or colonoscopy). This checks for the earliest forms of colorectal cancer.  Prostate and Testicular Cancer  Depending on your age and overall health, your health care provider may do certain tests to screen for prostate and testicular cancer.  Talk to your health care provider about any symptoms or concerns you have about testicular or prostate cancer.  Skin Cancer  Check your skin   from head to toe regularly.  Tell your health care provider about any new moles or changes in moles, especially if: ? There is a change in a mole's size, shape, or color. ? You have a mole that is larger than a pencil eraser.  Always use sunscreen. Apply sunscreen liberally and repeat throughout the day.  Protect yourself by wearing long sleeves, pants, a wide-brimmed hat, and sunglasses when outside.  What should I know about heart disease, diabetes, and high blood  pressure?  If you are 18-39 years of age, have your blood pressure checked every 3-5 years. If you are 40 years of age or older, have your blood pressure checked every year. You should have your blood pressure measured twice-once when you are at a hospital or clinic, and once when you are not at a hospital or clinic. Record the average of the two measurements. To check your blood pressure when you are not at a hospital or clinic, you can use: ? An automated blood pressure machine at a pharmacy. ? A home blood pressure monitor.  Talk to your health care provider about your target blood pressure.  If you are between 45-79 years old, ask your health care provider if you should take aspirin to prevent heart disease.  Have regular diabetes screenings by checking your fasting blood sugar level. ? If you are at a normal weight and have a low risk for diabetes, have this test once every three years after the age of 45. ? If you are overweight and have a high risk for diabetes, consider being tested at a younger age or more often.  A one-time screening for abdominal aortic aneurysm (AAA) by ultrasound is recommended for men aged 65-75 years who are current or former smokers. What should I know about preventing infection? Hepatitis B If you have a higher risk for hepatitis B, you should be screened for this virus. Talk with your health care provider to find out if you are at risk for hepatitis B infection. Hepatitis C Blood testing is recommended for:  Everyone born from 1945 through 1965.  Anyone with known risk factors for hepatitis C.  Sexually Transmitted Diseases (STDs)  You should be screened each year for STDs including gonorrhea and chlamydia if: ? You are sexually active and are younger than 45 years of age. ? You are older than 45 years of age and your health care provider tells you that you are at risk for this type of infection. ? Your sexual activity has changed since you were last  screened and you are at an increased risk for chlamydia or gonorrhea. Ask your health care provider if you are at risk.  Talk with your health care provider about whether you are at high risk of being infected with HIV. Your health care provider may recommend a prescription medicine to help prevent HIV infection.  What else can I do?  Schedule regular health, dental, and eye exams.  Stay current with your vaccines (immunizations).  Do not use any tobacco products, such as cigarettes, chewing tobacco, and e-cigarettes. If you need help quitting, ask your health care provider.  Limit alcohol intake to no more than 2 drinks per day. One drink equals 12 ounces of beer, 5 ounces of wine, or 1 ounces of hard liquor.  Do not use street drugs.  Do not share needles.  Ask your health care provider for help if you need support or information about quitting drugs.  Tell your health care   provider if you often feel depressed.  Tell your health care provider if you have ever been abused or do not feel safe at home. This information is not intended to replace advice given to you by your health care provider. Make sure you discuss any questions you have with your health care provider. Document Released: 08/16/2007 Document Revised: 10/17/2015 Document Reviewed: 11/21/2014 Elsevier Interactive Patient Education  2018 Elsevier Inc.  

## 2017-08-19 NOTE — Progress Notes (Signed)
Connor Tate 47 y.o.   Chief Complaint  Patient presents with  . Establish Care  . Annual Exam    HISTORY OF PRESENT ILLNESS: This is a 47 y.o. male Here for annual exam; no complaints and no medical concerns.  Past medical history includes WPW syndrome, prediabetes with hemoglobin A1c's from 6.1-6.4.  On no medications at present time.  Non-smoker, non-EtOH abuser, physically active.   HPI   Prior to Admission medications   Medication Sig Start Date End Date Taking? Authorizing Provider  Cetirizine HCl (ZYRTEC PO) Take by mouth daily.   Yes [provider]  Omeprazole Magnesium (PRILOSEC OTC PO) Take by mouth daily.   Yes [provider]    Allergies  Allergen Reactions  . Sulfa Antibiotics     Itchy and burning palms    Patient Active Problem List   Diagnosis Date Noted  . Type II diabetes mellitus with manifestations ([place]) 08/28/2015  . Essential hypertension 08/28/2015  . Wolff-Parkinson-White (WPW) syndrome, type A 08/28/2015  . 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 Routine general medical examination at a health care facility 08/03/2014  . OSA on CPAP 08/03/2014    Past Medical History:  Diagnosis Date  . Atypical chest pain   . L4-L5 disc bulge   . Right bundle branch block   . Snoring   . Wolff-Parkinson-White (WPW) syndrome     Past Surgical History:  Procedure Laterality Date  . NO PAST SURGERIES      Social History   Socioeconomic History  . Marital status: Married    Spouse name: Not on file  . Number of children: Not on file  . Years of education: Not on file  . Highest education level: Not on file  Occupational History  . Not on file  Social Needs  . Financial resource strain: Not on file  . Food insecurity:    Worry: Not on file    Inability: Not on file  . Transportation needs:    Medical: Not on file    Non-medical: Not on file  Tobacco Use  . Smoking status: Never Smoker  . Smokeless tobacco: Never Used    Substance and Sexual Activity  . Alcohol use: Yes    Alcohol/week: 0.6 oz    Types: 1 Cans of beer per week  . Drug use: No  . Sexual activity: Yes    Partners: Female  Lifestyle  . Physical activity:    Days per week: Not on file    Minutes per session: Not on file  . Stress: Not on file  Relationships  . Social connections:    Talks on phone: Not on file    Gets together: Not on file    Attends religious service: Not on file    Active member of club or organization: Not on file    Attends meetings of clubs or organizations: Not on file    Relationship status: Not on file  . Intimate partner violence:    Fear of current or ex partner: Not on file    Emotionally abused: Not on file    Physically abused: Not on file    Forced sexual activity: Not on file  Other Topics Concern  . Not on file  Social History Narrative  . Not on file    Family History  Problem Relation Age of Onset  . Heart disease Mother   . Alcohol abuse Neg Hx   . Cancer Neg Hx   .  COPD Neg Hx   . Depression Neg Hx   . Diabetes Neg Hx   . Drug abuse Neg Hx   . Early death Neg Hx   . Hearing loss Neg Hx   . Hyperlipidemia Neg Hx   . Hypertension Neg Hx   . Kidney disease Neg Hx   . Stroke Neg Hx      Review of Systems  Constitutional: Negative.  Negative for chills and fever.  HENT: Negative.  Negative for congestion, nosebleeds and sore throat.   Eyes: Negative.  Negative for blurred vision and double vision.  Respiratory: Negative.  Negative for cough and shortness of breath.   Cardiovascular: Negative.  Negative for chest pain and palpitations.  Gastrointestinal: Negative.  Negative for abdominal pain, blood in stool, nausea and vomiting.  Genitourinary: Negative.  Negative for dysuria and hematuria.  Musculoskeletal: Negative.  Negative for myalgias and neck pain.  Skin: Negative.  Negative for rash.  Neurological: Negative.  Negative for dizziness and headaches.  Endo/Heme/Allergies:  Negative.   All other systems reviewed and are negative.   Vitals:   08/19/17 1602  BP: 124/80  Pulse: 86  Resp: 16  Temp: 98.4 F (36.9 C)  SpO2: 96%    Physical Exam  Constitutional: He is oriented to person, place, and time. He appears well-nourished.  HENT:  Head: Normocephalic and atraumatic.  Nose: Nose normal.  Mouth/Throat: Oropharynx is clear and moist.  Eyes: Pupils are equal, round, and reactive to light. Conjunctivae and EOM are normal.  Neck: Normal range of motion. Neck supple.  Cardiovascular: Normal rate, regular rhythm, normal heart sounds and intact distal pulses.  Pulmonary/Chest: Effort normal and breath sounds normal.  Abdominal: Soft. Bowel sounds are normal. He exhibits no distension. There is no tenderness.  Musculoskeletal: Normal range of motion. He exhibits no edema or tenderness.  Lymphadenopathy:    He has no cervical adenopathy.  Neurological: He is alert and oriented to person, place, and time. No sensory deficit. He exhibits normal muscle tone.  Skin: Skin is warm and dry. Capillary refill takes less than 2 seconds. No rash noted.  Psychiatric: He has a normal mood and affect. His behavior is normal.  Vitals reviewed.    ASSESSMENT & PLAN: Connor Tate was seen today for establish care and annual exam.  Diagnoses and all orders for this visit:  Routine general medical examination at a health care facility -     CBC with Differential -     Comprehensive metabolic panel -     Hemoglobin A1c -     Lipid panel -     PSA(Must document that pt has been informed of limitations of PSA testing.) -     TSH -     HIV antibody    Patient Instructions       IF you received an x-ray today, you will receive an invoice from [facility]. Please contact [facility] at [phone] with questions or concerns regarding your invoice.   IF you received labwork today, you will receive an invoice from [facility]. Please contact [facility] at  [phone] with questions or concerns regarding your invoice.   Our billing staff will not be able to assist you with questions regarding bills from these companies.  You will be contacted with the lab results as soon as they are available. The fastest way to get your results is to activate your My Chart account. Instructions are located on the last page of this paperwork. If you have  not heard from [place] regarding the results in 2 weeks, please contact this office.      Health Maintenance, Male A healthy lifestyle and preventive care is important for your health and wellness. Ask your health care provider about what schedule of regular examinations is right for you. What should I know about weight and diet? Eat a Healthy Diet  Eat plenty of vegetables, fruits, whole grains, low-fat dairy products, and lean protein.  Do not eat a lot of foods high in solid fats, added sugars, or salt.  Maintain a Healthy Weight Regular exercise can help you achieve or maintain a healthy weight. You should:  Do at least 150 minutes of exercise each week. The exercise should increase your heart rate and make you sweat (moderate-intensity exercise).  Do strength-training exercises at least twice a week.  Watch Your Levels of Cholesterol and Blood Lipids  Have your blood tested for lipids and cholesterol every 5 years starting at 45 years of age. If you are at high risk for heart disease, you should start having your blood tested when you are 45 years old. You may need to have your cholesterol levels checked more often if: ? Your lipid or cholesterol levels are high. ? You are older than 45 years of age. ? You are at high risk for heart disease.  What should I know about cancer screening? Many types of cancers can be detected early and may often be prevented. Lung Cancer  You should be screened every year for lung cancer if: ? You are a current smoker who has smoked for at least 30 years. ? You are  a former smoker who has quit within the past 15 years.  Talk to your health care provider about your screening options, when you should start screening, and how often you should be screened.  Colorectal Cancer  Routine colorectal cancer screening usually begins at 45 years of age and should be repeated every 5-10 years until you are 45 years old. You may need to be screened more often if early forms of precancerous polyps or small growths are found. Your health care provider may recommend screening at an earlier age if you have risk factors for colon cancer.  Your health care provider may recommend using home test kits to check for hidden blood in the stool.  A small camera at the end of a tube can be used to examine your colon (sigmoidoscopy or colonoscopy). This checks for the earliest forms of colorectal cancer.  Prostate and Testicular Cancer  Depending on your age and overall health, your health care provider may do certain tests to screen for prostate and testicular cancer.  Talk to your health care provider about any symptoms or concerns you have about testicular or prostate cancer.  Skin Cancer  Check your skin from head to toe regularly.  Tell your health care provider about any new moles or changes in moles, especially if: ? There is a change in a mole's size, shape, or color. ? You have a mole that is larger than a pencil eraser.  Always use sunscreen. Apply sunscreen liberally and repeat throughout the day.  Protect yourself by wearing long sleeves, pants, a wide-brimmed hat, and sunglasses when outside.  What should I know about heart disease, diabetes, and high blood pressure?  If you are 49-31 years of age, have your blood pressure checked every 3-5 years. If you are 81 years of age or older, have your blood pressure checked every year.  You should have your blood pressure measured twice-once when you are at a hospital or clinic, and once when you are not at a hospital  or clinic. Record the average of the two measurements. To check your blood pressure when you are not at a hospital or clinic, you can use: ? An automated blood pressure machine at a pharmacy. ? A home blood pressure monitor.  Talk to your health care provider about your target blood pressure.  If you are between 20-38 years old, ask your health care provider if you should take aspirin to prevent heart disease.  Have regular diabetes screenings by checking your fasting blood sugar level. ? If you are at a normal weight and have a low risk for diabetes, have this test once every three years after the age of 73. ? If you are overweight and have a high risk for diabetes, consider being tested at a younger age or more often.  A one-time screening for abdominal aortic aneurysm (AAA) by ultrasound is recommended for men aged 36-75 years who are current or former smokers. What should I know about preventing infection? Hepatitis B If you have a higher risk for hepatitis B, you should be screened for this virus. Talk with your health care provider to find out if you are at risk for hepatitis B infection. Hepatitis C Blood testing is recommended for:  Everyone born from [birth year] through 1965.  Anyone with known risk factors for hepatitis C.  Sexually Transmitted Diseases (STDs)  You should be screened each year for STDs including gonorrhea and chlamydia if: ? You are sexually active and are younger than 45 years of age. ? You are older than 45 years of age and your health care provider tells you that you are at risk for this type of infection. ? Your sexual activity has changed since you were last screened and you are at an increased risk for chlamydia or gonorrhea. Ask your health care provider if you are at risk.  Talk with your health care provider about whether you are at high risk of being infected with HIV. Your health care provider may recommend a prescription medicine to help prevent HIV  infection.  What else can I do?  Schedule regular health, dental, and eye exams.  Stay current with your vaccines (immunizations).  Do not use any tobacco products, such as cigarettes, chewing tobacco, and e-cigarettes. If you need help quitting, ask your health care provider.  Limit alcohol intake to no more than 2 drinks per day. One drink equals 12 ounces of beer, 5 ounces of wine, or 1 ounces of hard liquor.  Do not use street drugs.  Do not share needles.  Ask your health care provider for help if you need support or information about quitting drugs.  Tell your health care provider if you often feel depressed.  Tell your health care provider if you have ever been abused or do not feel safe at home. This information is not intended to replace advice given to you by your health care provider. Make sure you discuss any questions you have with your health care provider. Document Released: 08/16/2007 Document Revised: 10/17/2015 Document Reviewed: 11/21/2014 Elsevier Interactive Patient Education  2018 [employer] Inc.      Agustina Caroli, MD [facility]

## 2017-08-20 LAB — COMPREHENSIVE METABOLIC PANEL
ALT: 19 IU/L (ref 0–44)
AST: 14 IU/L (ref 0–40)
Albumin/Globulin Ratio: 1.9 (ref 1.2–2.2)
Albumin: 4.6 g/dL (ref 3.5–5.5)
Alkaline Phosphatase: 114 IU/L (ref 39–117)
BUN/Creatinine Ratio: 17 (ref 9–20)
BUN: 18 mg/dL (ref 6–24)
Bilirubin Total: <0.2 mg/dL (ref 0.0–1.2)
CALCIUM: 9.2 mg/dL (ref 8.7–10.2)
CO2: 23 mmol/L (ref 20–29)
CREATININE: 1.06 mg/dL (ref 0.76–1.27)
Chloride: 101 mmol/L (ref 96–106)
GFR calc Af Amer: 98 mL/min/{1.73_m2} (ref >59)
GFR, EST NON AFRICAN AMERICAN: 85 mL/min/{1.73_m2} (ref >59)
GLOBULIN, TOTAL: 2.4 g/dL (ref 1.5–4.5)
Glucose: 98 mg/dL (ref 65–99)
Potassium: 4.4 mmol/L (ref 3.5–5.2)
SODIUM: 142 mmol/L (ref 134–144)
Total Protein: 7 g/dL (ref 6.0–8.5)

## 2017-08-20 LAB — CBC WITH DIFFERENTIAL/PLATELET
Basophils Absolute: 0.1 10*3/uL (ref 0.0–0.2)
Basos: 1 %
EOS (ABSOLUTE): 0.2 10*3/uL (ref 0.0–0.4)
EOS: 2 %
HEMATOCRIT: 49 % (ref 37.5–51.0)
HEMOGLOBIN: 16.9 g/dL (ref 13.0–17.7)
IMMATURE GRANS (ABS): 0 10*3/uL (ref 0.0–0.1)
IMMATURE GRANULOCYTES: 0 %
LYMPHS: 27 %
Lymphocytes Absolute: 2.5 10*3/uL (ref 0.7–3.1)
MCH: 28.7 pg (ref 26.6–33.0)
MCHC: 34.5 g/dL (ref 31.5–35.7)
MCV: 83 fL (ref 79–97)
MONOCYTES: 8 %
MONOS ABS: 0.7 10*3/uL (ref 0.1–0.9)
NEUTROS PCT: 62 %
Neutrophils Absolute: 5.7 10*3/uL (ref 1.4–7.0)
Platelets: 277 10*3/uL (ref 150–450)
RBC: 5.89 x10E6/uL — AB (ref 4.14–5.80)
RDW: 14.2 % (ref 12.3–15.4)
WBC: 9.2 10*3/uL (ref 3.4–10.8)

## 2017-08-20 LAB — HEMOGLOBIN A1C
ESTIMATED AVERAGE GLUCOSE: 148 mg/dL
Hgb A1c MFr Bld: 6.8 % — ABNORMAL HIGH (ref 4.8–5.6)

## 2017-08-20 LAB — TSH: TSH: 1.74 u[IU]/mL (ref 0.450–4.500)

## 2017-08-20 LAB — LIPID PANEL
CHOLESTEROL TOTAL: 254 mg/dL — AB (ref 100–199)
Chol/HDL Ratio: 6.7 ratio — ABNORMAL HIGH (ref 0.0–5.0)
HDL: 38 mg/dL — AB (ref >39)
LDL Calculated: 155 mg/dL — ABNORMAL HIGH (ref 0–99)
Triglycerides: 305 mg/dL — ABNORMAL HIGH (ref 0–149)
VLDL CHOLESTEROL CAL: 61 mg/dL — AB (ref 5–40)

## 2017-08-20 LAB — PSA: Prostate Specific Ag, Serum: 0.9 ng/mL (ref 0.0–4.0)

## 2017-08-20 LAB — HIV ANTIBODY (ROUTINE TESTING W REFLEX): HIV Screen 4th Generation wRfx: NONREACTIVE

## 2017-08-21 ENCOUNTER — Other Ambulatory Visit: Payer: Self-pay | Admitting: Emergency Medicine

## 2017-08-21 DIAGNOSIS — E1169 Type 2 diabetes mellitus with other specified complication: Secondary | ICD-10-CM

## 2017-08-21 DIAGNOSIS — E785 Hyperlipidemia, unspecified: Principal | ICD-10-CM

## 2017-08-21 MED ORDER — PRAVASTATIN SODIUM 40 MG PO TABS: 40.0000 mg | ORAL_TABLET | Freq: Every day | ORAL | 3 refills | Status: DC

## 2017-08-22 ENCOUNTER — Encounter: Payer: Self-pay | Admitting: Radiology

## 2017-08-25 ENCOUNTER — Other Ambulatory Visit: Payer: Self-pay

## 2017-08-25 ENCOUNTER — Encounter: Payer: Self-pay | Admitting: Emergency Medicine

## 2017-08-25 ENCOUNTER — Ambulatory Visit (INDEPENDENT_AMBULATORY_CARE_PROVIDER_SITE_OTHER): Payer: PRIVATE HEALTH INSURANCE | Admitting: Emergency Medicine

## 2017-08-25 VITALS — BP 120/90 | HR 84 | Temp 98.0°F | Resp 16 | Wt 214.2 lb

## 2017-08-25 DIAGNOSIS — E119 Type 2 diabetes mellitus without complications: Secondary | ICD-10-CM

## 2017-08-25 DIAGNOSIS — E785 Hyperlipidemia, unspecified: Secondary | ICD-10-CM | POA: Insufficient documentation

## 2017-08-25 MED ORDER — METFORMIN HCL 500 MG PO TABS: 500.0000 mg | ORAL_TABLET | Freq: Two times a day (BID) | ORAL | 3 refills | Status: DC

## 2017-08-25 NOTE — Progress Notes (Signed)
Connor Tate 47 y.o.   Chief Complaint  Patient presents with  . Annual Exam    HISTORY OF PRESENT ILLNESS: This is a 47 y.o. male seen by me on 08/19/2017 for an annual exam.  Blood work showed 2 things: Diabetes and high cholesterol and triglycerides.  CMP normal.  CBC normal.  PSA and TSH normal.  Lab results discussed with patient in the room.  Here today for follow-up.  HPI   Prior to Admission medications   Medication Sig Start Date End Date Taking? Authorizing Provider  Cetirizine HCl (ZYRTEC PO) Take by mouth daily.   Yes [provider]  Omeprazole Magnesium (PRILOSEC OTC PO) Take by mouth daily.   Yes [provider]  pravastatin (PRAVACHOL) 40 MG tablet Take 1 tablet (40 mg total) by mouth daily. Patient not taking: Reported on 08/25/2017 08/21/17   Horald Pollen, MD    Allergies  Allergen Reactions  . Sulfa Antibiotics     Itchy and burning palms    Patient Active Problem List   Diagnosis Date Noted  . Type II diabetes mellitus with manifestations ([place]) 08/28/2015  . Essential hypertension 08/28/2015  . Wolff-Parkinson-White (WPW) syndrome, type A 08/28/2015  . 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 Routine general medical examination at a health care facility 08/03/2014  . OSA on CPAP 08/03/2014    Past Medical History:  Diagnosis Date  . Allergy   . Atypical chest pain   . L4-L5 disc bulge   . Right bundle branch block   . Snoring   . Wolff-Parkinson-White (WPW) syndrome     Past Surgical History:  Procedure Laterality Date  . NO PAST SURGERIES    . SHOULDER SURGERY Bilateral    7-8 year ago    Social History   Socioeconomic History  . Marital status: Married    Spouse name: Not on file  . Number of children: Not on file  . Years of education: Not on file  . Highest education level: Not on file  Occupational History  . Not on file  Social Needs  . Financial resource strain: Not on file  . Food insecurity:   Worry: Not on file    Inability: Not on file  . Transportation needs:    Medical: Not on file    Non-medical: Not on file  Tobacco Use  . Smoking status: Never Smoker  . Smokeless tobacco: Never Used  Substance and Sexual Activity  . Alcohol use: Yes    Alcohol/week: 0.6 oz    Types: 1 Cans of beer per week  . Drug use: No  . Sexual activity: Yes    Partners: Female  Lifestyle  . Physical activity:    Days per week: Not on file    Minutes per session: Not on file  . Stress: Not on file  Relationships  . Social connections:    Talks on phone: Not on file    Gets together: Not on file    Attends religious service: Not on file    Active member of club or organization: Not on file    Attends meetings of clubs or organizations: Not on file    Relationship status: Not on file  . Intimate partner violence:    Fear of current or ex partner: Not on file    Emotionally abused: Not on file    Physically abused: Not on file    Forced sexual activity: Not on file  Other Topics Concern  .  Not on file  Social History Narrative  . Not on file    Family History  Problem Relation Age of Onset  . Heart disease Mother   . Alcohol abuse Neg Hx   . Cancer Neg Hx   . COPD Neg Hx   . Depression Neg Hx   . Diabetes Neg Hx   . Drug abuse Neg Hx   . Early death Neg Hx   . Hearing loss Neg Hx   . Hyperlipidemia Neg Hx   . Hypertension Neg Hx   . Kidney disease Neg Hx   . Stroke Neg Hx      Review of Systems  Constitutional: Negative.  Negative for chills and fever.  HENT: Negative.  Negative for congestion, nosebleeds and sore throat.   Eyes: Negative.  Negative for blurred vision and double vision.  Respiratory: Negative.  Negative for cough and hemoptysis.   Cardiovascular: Negative.  Negative for chest pain and palpitations.  Gastrointestinal: Negative for abdominal pain, diarrhea, nausea and vomiting.  Genitourinary: Negative.  Negative for dysuria and hematuria.    Musculoskeletal: Negative.   Skin: Negative.  Negative for rash.  Neurological: Negative.  Negative for dizziness and headaches.  Endo/Heme/Allergies: Negative.   All other systems reviewed and are negative.   Vitals:   08/25/17 0918  BP: 120/90  Pulse: 84  Resp: 16  Temp: 98 F (36.7 C)  SpO2: 96%    Physical Exam  Constitutional: He is oriented to person, place, and time. He appears well-developed and well-nourished.  HENT:  Head: Normocephalic and atraumatic.  Eyes: Pupils are equal, round, and reactive to light. EOM are normal.  Neck: Normal range of motion. Neck supple.  Cardiovascular: Normal rate.  Pulmonary/Chest: Effort normal.  Musculoskeletal: Normal range of motion.  Neurological: He is alert and oriented to person, place, and time. No sensory deficit. He exhibits normal muscle tone.  Skin: Skin is warm and dry. Capillary refill takes less than 2 seconds. No rash noted.  Psychiatric: He has a normal mood and affect. His behavior is normal.  Vitals reviewed.  A total of 25 minutes was spent in the room with the patient, greater than 50% of which was in counseling/coordination of care regarding diabetes and high cholesterol, diet and nutrition, medications, and need for follow-up.   ASSESSMENT & PLAN: Connor Tate was seen today for annual exam.  Diagnoses and all orders for this visit:  Type 2 diabetes mellitus without complication, without long-term current use of insulin ([place]) -     metFORMIN (GLUCOPHAGE) 500 MG tablet; Take 1 tablet (500 mg total) by mouth 2 (two) times daily with a meal.  Dyslipidemia    Patient Instructions    Start metformin and pravastatin as prescribed.   IF you received an x-ray today, you will receive an invoice from [facility]. Please contact [facility] at [phone] with questions or concerns regarding your invoice.   IF you received labwork today, you will receive an invoice from [place]. Please contact  [facility] at [phone] with questions or concerns regarding your invoice.   Our billing staff will not be able to assist you with questions regarding bills from these companies.  You will be contacted with the lab results as soon as they are available. The fastest way to get your results is to activate your My Chart account. Instructions are located on the last page of this paperwork. If you have not heard from [place] regarding the results in 2 weeks, please contact this  office.     Cholesterol Cholesterol is a fat. Your body needs a small amount of cholesterol. Cholesterol (plaque) may build up in your blood vessels (arteries). That makes you more likely to have a heart attack or stroke. You cannot feel your cholesterol level. Having a blood test is the only way to find out if your level is high. Keep your test results. Work with your doctor to keep your cholesterol at a good level. What do the results mean?  Total cholesterol is how much cholesterol is in your blood.  LDL is bad cholesterol. This is the type that can build up. Try to have low LDL.  HDL is good cholesterol. It cleans your blood vessels and carries LDL away. Try to have high HDL.  Triglycerides are fat that the body can store or burn for energy. What are good levels of cholesterol?  Total cholesterol below 200.  LDL below 100 is good for people who have health risks. LDL below 70 is good for people who have very high risks.  HDL above 40 is good. It is best to have HDL of 60 or higher.  Triglycerides below 150. How can I lower my cholesterol? Diet Follow your diet program as told by your doctor.  Choose fish, white meat chicken, or [place] that is roasted or baked. Try not to eat red meat, fried foods, sausage, or lunch meats.  Eat lots of fresh fruits and vegetables.  Choose whole grains, beans, pasta, potatoes, and cereals.  Choose olive oil, corn oil, or canola oil. Only use small amounts.  Try not to eat  butter, mayonnaise, shortening, or palm kernel oils.  Try not to eat foods with trans fats.  Choose low-fat or nonfat dairy foods. ? Drink skim or nonfat milk. ? Eat low-fat or nonfat yogurt and cheeses. ? Try not to drink whole milk or cream. ? Try not to eat ice cream, egg yolks, or full-fat cheeses.  Healthy desserts include angel food cake, ginger snaps, animal crackers, hard candy, popsicles, and low-fat or nonfat frozen yogurt. Try not to eat pastries, cakes, pies, and cookies.  Exercise Follow your exercise program as told by your doctor.  Be more active. Try gardening, walking, and taking the stairs.  Ask your doctor about ways that you can be more active.  Medicine  Take over-the-counter and prescription medicines only as told by your doctor. This information is not intended to replace advice given to you by your health care provider. Make sure you discuss any questions you have with your health care provider. Document Released: 05/16/2008 Document Revised: 09/19/2015 Document Reviewed: 08/30/2015 Elsevier Interactive Patient Education  2018 [employer].  Diabetes Mellitus and Nutrition When you have diabetes (diabetes mellitus), it is very important to have healthy eating habits because your blood sugar (glucose) levels are greatly affected by what you eat and drink. Eating healthy foods in the appropriate amounts, at about the same times every day, can help you:  Control your blood glucose.  Lower your risk of heart disease.  Improve your blood pressure.  Reach or maintain a healthy weight.  Every person with diabetes is different, and each person has different needs for a meal plan. Your health care provider may recommend that you work with a diet and nutrition specialist (dietitian) to make a meal plan that is best for you. Your meal plan may vary depending on factors such as:  The calories you need.  The medicines you take.  Your weight.  Your blood  glucose, blood pressure, and cholesterol levels.  Your activity level.  Other health conditions you have, such as heart or kidney disease.  How do carbohydrates affect me? Carbohydrates affect your blood glucose level more than any other type of food. Eating carbohydrates naturally increases the amount of glucose in your blood. Carbohydrate counting is a method for keeping track of how many carbohydrates you eat. Counting carbohydrates is important to keep your blood glucose at a healthy level, especially if you use insulin or take certain oral diabetes medicines. It is important to know how many carbohydrates you can safely have in each meal. This is different for every person. Your dietitian can help you calculate how many carbohydrates you should have at each meal and for snack. Foods that contain carbohydrates include:  Bread, cereal, rice, pasta, and crackers.  Potatoes and corn.  Peas, beans, and lentils.  Milk and yogurt.  Fruit and juice.  Desserts, such as cakes, cookies, ice cream, and candy.  How does alcohol affect me? Alcohol can cause a sudden decrease in blood glucose (hypoglycemia), especially if you use insulin or take certain oral diabetes medicines. Hypoglycemia can be a life-threatening condition. Symptoms of hypoglycemia (sleepiness, dizziness, and confusion) are similar to symptoms of having too much alcohol. If your health care provider says that alcohol is safe for you, follow these guidelines:  Limit alcohol intake to no more than 1 drink per day for nonpregnant women and 2 drinks per day for men. One drink equals 12 oz of beer, 5 oz of wine, or 1 oz of hard liquor.  Do not drink on an empty stomach.  Keep yourself hydrated with water, diet soda, or unsweetened iced tea.  Keep in mind that regular soda, juice, and other mixers may contain a lot of sugar and must be counted as carbohydrates.  What are tips for following this plan? Reading food  labels  Start by checking the serving size on the label. The amount of calories, carbohydrates, fats, and other nutrients listed on the label are based on one serving of the food. Many foods contain more than one serving per package.  Check the total grams (g) of carbohydrates in one serving. You can calculate the number of servings of carbohydrates in one serving by dividing the total carbohydrates by 15. For example, if a food has 30 g of total carbohydrates, it would be equal to 2 servings of carbohydrates.  Check the number of grams (g) of saturated and trans fats in one serving. Choose foods that have low or no amount of these fats.  Check the number of milligrams (mg) of sodium in one serving. Most people should limit total sodium intake to less than 2,300 mg per day.  Always check the nutrition information of foods labeled as "low-fat" or "nonfat". These foods may be higher in added sugar or refined carbohydrates and should be avoided.  Talk to your dietitian to identify your daily goals for nutrients listed on the label. Shopping  Avoid buying canned, premade, or processed foods. These foods tend to be high in fat, sodium, and added sugar.  Shop around the outside edge of the grocery store. This includes fresh fruits and vegetables, bulk grains, fresh meats, and fresh dairy. Cooking  Use low-heat cooking methods, such as baking, instead of high-heat cooking methods like deep frying.  Cook using healthy oils, such as olive, canola, or sunflower oil.  Avoid cooking with butter, cream, or high-fat meats. Meal planning  Eat meals and snacks regularly, preferably at the same times every day. Avoid going long periods of time without eating.  Eat foods high in fiber, such as fresh fruits, vegetables, beans, and whole grains. Talk to your dietitian about how many servings of carbohydrates you can eat at each meal.  Eat 4-6 ounces of lean protein each day, such as lean meat, chicken,  fish, eggs, or tofu. 1 ounce is equal to 1 ounce of meat, chicken, or fish, 1 egg, or 1/4 cup of tofu.  Eat some foods each day that contain healthy fats, such as avocado, nuts, seeds, and fish. Lifestyle   Check your blood glucose regularly.  Exercise at least 30 minutes 5 or more days each week, or as told by your health care provider.  Take medicines as told by your health care provider.  Do not use any products that contain nicotine or tobacco, such as cigarettes and e-cigarettes. If you need help quitting, ask your health care provider.  Work with a Social worker or diabetes educator to identify strategies to manage stress and any emotional and social challenges. What are some questions to ask my health care provider?  Do I need to meet with a diabetes educator?  Do I need to meet with a dietitian?  What number can I call if I have questions?  When are the best times to check my blood glucose? Where to find more information:  American Diabetes Association: diabetes.org/food-and-fitness/food  Academy of Nutrition and Dietetics: PokerClues.dk  [employer] of Diabetes and Digestive and Kidney Diseases (NIH): ContactWire.be Summary  A healthy meal plan will help you control your blood glucose and maintain a healthy lifestyle.  Working with a diet and nutrition specialist (dietitian) can help you make a meal plan that is best for you.  Keep in mind that carbohydrates and alcohol have immediate effects on your blood glucose levels. It is important to count carbohydrates and to use alcohol carefully. This information is not intended to replace advice given to you by your health care provider. Make sure you discuss any questions you have with your health care provider. Document Released: 11/14/2004 Document Revised: 03/24/2016 Document Reviewed:  03/24/2016 Elsevier Interactive Patient Education  2018 [employer].      Agustina Caroli, MD [facility]

## 2017-08-25 NOTE — Patient Instructions (Addendum)
Start metformin and pravastatin as prescribed.   IF you received an x-ray today, you will receive an invoice from [facility]. Please contact [facility] at [phone] with questions or concerns regarding your invoice.   IF you received labwork today, you will receive an invoice from [facility]. Please contact [facility] at [phone] with questions or concerns regarding your invoice.   Our billing staff will not be able to assist you with questions regarding bills from these companies.  You will be contacted with the lab results as soon as they are available. The fastest way to get your results is to activate your My Chart account. Instructions are located on the last page of this paperwork. If you have not heard from [place] regarding the results in 2 weeks, please contact this office.     Cholesterol Cholesterol is a fat. Your body needs a small amount of cholesterol. Cholesterol (plaque) may build up in your blood vessels (arteries). That makes you more likely to have a heart attack or stroke. You cannot feel your cholesterol level. Having a blood test is the only way to find out if your level is high. Keep your test results. Work with your doctor to keep your cholesterol at a good level. What do the results mean?  Total cholesterol is how much cholesterol is in your blood.  LDL is bad cholesterol. This is the type that can build up. Try to have low LDL.  HDL is good cholesterol. It cleans your blood vessels and carries LDL away. Try to have high HDL.  Triglycerides are fat that the body can store or burn for energy. What are good levels of cholesterol?  Total cholesterol below 200.  LDL below 100 is good for people who have health risks. LDL below 70 is good for people who have very high risks.  HDL above 40 is good. It is best to have HDL of 60 or higher.  Triglycerides below 150. How can I lower my cholesterol? Diet Follow your diet program as told by your  doctor.  Choose fish, white meat chicken, or [place] that is roasted or baked. Try not to eat red meat, fried foods, sausage, or lunch meats.  Eat lots of fresh fruits and vegetables.  Choose whole grains, beans, pasta, potatoes, and cereals.  Choose olive oil, corn oil, or canola oil. Only use small amounts.  Try not to eat butter, mayonnaise, shortening, or palm kernel oils.  Try not to eat foods with trans fats.  Choose low-fat or nonfat dairy foods. ? Drink skim or nonfat milk. ? Eat low-fat or nonfat yogurt and cheeses. ? Try not to drink whole milk or cream. ? Try not to eat ice cream, egg yolks, or full-fat cheeses.  Healthy desserts include angel food cake, ginger snaps, animal crackers, hard candy, popsicles, and low-fat or nonfat frozen yogurt. Try not to eat pastries, cakes, pies, and cookies.  Exercise Follow your exercise program as told by your doctor.  Be more active. Try gardening, walking, and taking the stairs.  Ask your doctor about ways that you can be more active.  Medicine  Take over-the-counter and prescription medicines only as told by your doctor. This information is not intended to replace advice given to you by your health care provider. Make sure you discuss any questions you have with your health care provider. Document Released: 05/16/2008 Document Revised: 09/19/2015 Document Reviewed: 08/30/2015 Elsevier Interactive Patient Education  2018 [employer].  Diabetes Mellitus and Nutrition When you have  diabetes (diabetes mellitus), it is very important to have healthy eating habits because your blood sugar (glucose) levels are greatly affected by what you eat and drink. Eating healthy foods in the appropriate amounts, at about the same times every day, can help you:  Control your blood glucose.  Lower your risk of heart disease.  Improve your blood pressure.  Reach or maintain a healthy weight.  Every person with diabetes is different, and  each person has different needs for a meal plan. Your health care provider may recommend that you work with a diet and nutrition specialist (dietitian) to make a meal plan that is best for you. Your meal plan may vary depending on factors such as:  The calories you need.  The medicines you take.  Your weight.  Your blood glucose, blood pressure, and cholesterol levels.  Your activity level.  Other health conditions you have, such as heart or kidney disease.  How do carbohydrates affect me? Carbohydrates affect your blood glucose level more than any other type of food. Eating carbohydrates naturally increases the amount of glucose in your blood. Carbohydrate counting is a method for keeping track of how many carbohydrates you eat. Counting carbohydrates is important to keep your blood glucose at a healthy level, especially if you use insulin or take certain oral diabetes medicines. It is important to know how many carbohydrates you can safely have in each meal. This is different for every person. Your dietitian can help you calculate how many carbohydrates you should have at each meal and for snack. Foods that contain carbohydrates include:  Bread, cereal, rice, pasta, and crackers.  Potatoes and corn.  Peas, beans, and lentils.  Milk and yogurt.  Fruit and juice.  Desserts, such as cakes, cookies, ice cream, and candy.  How does alcohol affect me? Alcohol can cause a sudden decrease in blood glucose (hypoglycemia), especially if you use insulin or take certain oral diabetes medicines. Hypoglycemia can be a life-threatening condition. Symptoms of hypoglycemia (sleepiness, dizziness, and confusion) are similar to symptoms of having too much alcohol. If your health care provider says that alcohol is safe for you, follow these guidelines:  Limit alcohol intake to no more than 1 drink per day for nonpregnant women and 2 drinks per day for men. One drink equals 12 oz of beer, 5 oz of  wine, or 1 oz of hard liquor.  Do not drink on an empty stomach.  Keep yourself hydrated with water, diet soda, or unsweetened iced tea.  Keep in mind that regular soda, juice, and other mixers may contain a lot of sugar and must be counted as carbohydrates.  What are tips for following this plan? Reading food labels  Start by checking the serving size on the label. The amount of calories, carbohydrates, fats, and other nutrients listed on the label are based on one serving of the food. Many foods contain more than one serving per package.  Check the total grams (g) of carbohydrates in one serving. You can calculate the number of servings of carbohydrates in one serving by dividing the total carbohydrates by 15. For example, if a food has 30 g of total carbohydrates, it would be equal to 2 servings of carbohydrates.  Check the number of grams (g) of saturated and trans fats in one serving. Choose foods that have low or no amount of these fats.  Check the number of milligrams (mg) of sodium in one serving. Most people should limit total sodium intake to  less than 2,300 mg per day.  Always check the nutrition information of foods labeled as "low-fat" or "nonfat". These foods may be higher in added sugar or refined carbohydrates and should be avoided.  Talk to your dietitian to identify your daily goals for nutrients listed on the label. Shopping  Avoid buying canned, premade, or processed foods. These foods tend to be high in fat, sodium, and added sugar.  Shop around the outside edge of the grocery store. This includes fresh fruits and vegetables, bulk grains, fresh meats, and fresh dairy. Cooking  Use low-heat cooking methods, such as baking, instead of high-heat cooking methods like deep frying.  Cook using healthy oils, such as olive, canola, or sunflower oil.  Avoid cooking with butter, cream, or high-fat meats. Meal planning  Eat meals and snacks regularly, preferably at the  same times every day. Avoid going long periods of time without eating.  Eat foods high in fiber, such as fresh fruits, vegetables, beans, and whole grains. Talk to your dietitian about how many servings of carbohydrates you can eat at each meal.  Eat 4-6 ounces of lean protein each day, such as lean meat, chicken, fish, eggs, or tofu. 1 ounce is equal to 1 ounce of meat, chicken, or fish, 1 egg, or 1/4 cup of tofu.  Eat some foods each day that contain healthy fats, such as avocado, nuts, seeds, and fish. Lifestyle   Check your blood glucose regularly.  Exercise at least 30 minutes 5 or more days each week, or as told by your health care provider.  Take medicines as told by your health care provider.  Do not use any products that contain nicotine or tobacco, such as cigarettes and e-cigarettes. If you need help quitting, ask your health care provider.  Work with a Social worker or diabetes educator to identify strategies to manage stress and any emotional and social challenges. What are some questions to ask my health care provider?  Do I need to meet with a diabetes educator?  Do I need to meet with a dietitian?  What number can I call if I have questions?  When are the best times to check my blood glucose? Where to find more information:  American Diabetes Association: diabetes.org/food-and-fitness/food  Academy of Nutrition and Dietetics: PokerClues.dk  [employer] of Diabetes and Digestive and Kidney Diseases (NIH): ContactWire.be Summary  A healthy meal plan will help you control your blood glucose and maintain a healthy lifestyle.  Working with a diet and nutrition specialist (dietitian) can help you make a meal plan that is best for you.  Keep in mind that carbohydrates and alcohol have immediate effects on your blood glucose levels. It is  important to count carbohydrates and to use alcohol carefully. This information is not intended to replace advice given to you by your health care provider. Make sure you discuss any questions you have with your health care provider. Document Released: 11/14/2004 Document Revised: 03/24/2016 Document Reviewed: 03/24/2016 Elsevier Interactive Patient Education  [employer].

## 2017-12-01 ENCOUNTER — Other Ambulatory Visit: Payer: Self-pay

## 2017-12-01 ENCOUNTER — Encounter: Payer: Self-pay | Admitting: Emergency Medicine

## 2017-12-01 ENCOUNTER — Ambulatory Visit: Payer: PRIVATE HEALTH INSURANCE | Admitting: Emergency Medicine

## 2017-12-01 VITALS — BP 116/78 | HR 76 | Temp 98.6°F | Resp 16 | Ht 71.0 in | Wt 215.6 lb

## 2017-12-01 DIAGNOSIS — E119 Type 2 diabetes mellitus without complications: Secondary | ICD-10-CM | POA: Diagnosis not present

## 2017-12-01 DIAGNOSIS — E785 Hyperlipidemia, unspecified: Secondary | ICD-10-CM | POA: Diagnosis not present

## 2017-12-01 LAB — POCT GLYCOSYLATED HEMOGLOBIN (HGB A1C): Hemoglobin A1C: 6.8 % — AB (ref 4.0–5.6)

## 2017-12-01 LAB — COMPREHENSIVE METABOLIC PANEL
A/G RATIO: 1.8 (ref 1.2–2.2)
ALBUMIN: 4.5 g/dL (ref 3.5–5.5)
ALT: 22 IU/L (ref 0–44)
AST: 16 IU/L (ref 0–40)
Alkaline Phosphatase: 119 IU/L — ABNORMAL HIGH (ref 39–117)
BUN/Creatinine Ratio: 15 (ref 9–20)
BUN: 14 mg/dL (ref 6–24)
Bilirubin Total: 0.4 mg/dL (ref 0.0–1.2)
CALCIUM: 9.5 mg/dL (ref 8.7–10.2)
CO2: 22 mmol/L (ref 20–29)
Chloride: 103 mmol/L (ref 96–106)
Creatinine, Ser: 0.95 mg/dL (ref 0.76–1.27)
GFR, EST AFRICAN AMERICAN: 112 mL/min/{1.73_m2} (ref >59)
GFR, EST NON AFRICAN AMERICAN: 97 mL/min/{1.73_m2} (ref >59)
Globulin, Total: 2.5 g/dL (ref 1.5–4.5)
Glucose: 134 mg/dL — ABNORMAL HIGH (ref 65–99)
POTASSIUM: 4.3 mmol/L (ref 3.5–5.2)
Sodium: 142 mmol/L (ref 134–144)
TOTAL PROTEIN: 7 g/dL (ref 6.0–8.5)

## 2017-12-01 LAB — LIPID PANEL
CHOL/HDL RATIO: 4.3 ratio (ref 0.0–5.0)
Cholesterol, Total: 197 mg/dL (ref 100–199)
HDL: 46 mg/dL (ref >39)
LDL CALC: 117 mg/dL — AB (ref 0–99)
Triglycerides: 171 mg/dL — ABNORMAL HIGH (ref 0–149)
VLDL Cholesterol Cal: 34 mg/dL (ref 5–40)

## 2017-12-01 LAB — GLUCOSE, POCT (MANUAL RESULT ENTRY): POC GLUCOSE: 130 mg/dL — AB (ref 70–99)

## 2017-12-01 NOTE — Patient Instructions (Addendum)
   If you have lab work done today you will be contacted with your lab results within the next 2 weeks.  If you have not heard from us then please contact us. The fastest way to get your results is to register for My Chart.   IF you received an x-ray today, you will receive an invoice from [facility]. Please contact [facility] at [phone] with questions or concerns regarding your invoice.   IF you received labwork today, you will receive an invoice from [facility]. Please contact [facility] at [phone] with questions or concerns regarding your invoice.   Our billing staff will not be able to assist you with questions regarding bills from these companies.  You will be contacted with the lab results as soon as they are available. The fastest way to get your results is to activate your My Chart account. Instructions are located on the last page of this paperwork. If you have not heard from us regarding the results in 2 weeks, please contact this office.     Diabetes Mellitus and Nutrition When you have diabetes (diabetes mellitus), it is very important to have healthy eating habits because your blood sugar (glucose) levels are greatly affected by what you eat and drink. Eating healthy foods in the appropriate amounts, at about the same times every day, can help you:  Control your blood glucose.  Lower your risk of heart disease.  Improve your blood pressure.  Reach or maintain a healthy weight.  Every person with diabetes is different, and each person has different needs for a meal plan. Your health care provider may recommend that you work with a diet and nutrition specialist (dietitian) to make a meal plan that is best for you. Your meal plan may vary depending on factors such as:  The calories you need.  The medicines you take.  Your weight.  Your blood glucose, blood pressure, and cholesterol levels.  Your activity level.  Other health conditions you  have, such as heart or kidney disease.  How do carbohydrates affect me? Carbohydrates affect your blood glucose level more than any other type of food. Eating carbohydrates naturally increases the amount of glucose in your blood. Carbohydrate counting is a method for keeping track of how many carbohydrates you eat. Counting carbohydrates is important to keep your blood glucose at a healthy level, especially if you use insulin or take certain oral diabetes medicines. It is important to know how many carbohydrates you can safely have in each meal. This is different for every person. Your dietitian can help you calculate how many carbohydrates you should have at each meal and for snack. Foods that contain carbohydrates include:  Bread, cereal, rice, pasta, and crackers.  Potatoes and corn.  Peas, beans, and lentils.  Milk and yogurt.  Fruit and juice.  Desserts, such as cakes, cookies, ice cream, and candy.  How does alcohol affect me? Alcohol can cause a sudden decrease in blood glucose (hypoglycemia), especially if you use insulin or take certain oral diabetes medicines. Hypoglycemia can be a life-threatening condition. Symptoms of hypoglycemia (sleepiness, dizziness, and confusion) are similar to symptoms of having too much alcohol. If your health care provider says that alcohol is safe for you, follow these guidelines:  Limit alcohol intake to no more than 1 drink per day for nonpregnant women and 2 drinks per day for men. One drink equals 12 oz of beer, 5 oz of wine, or 1 oz of hard liquor.  Do   not drink on an empty stomach.  Keep yourself hydrated with water, diet soda, or unsweetened iced tea.  Keep in mind that regular soda, juice, and other mixers may contain a lot of sugar and must be counted as carbohydrates.  What are tips for following this plan? Reading food labels  Start by checking the serving size on the label. The amount of calories, carbohydrates, fats, and other  nutrients listed on the label are based on one serving of the food. Many foods contain more than one serving per package.  Check the total grams (g) of carbohydrates in one serving. You can calculate the number of servings of carbohydrates in one serving by dividing the total carbohydrates by 15. For example, if a food has 30 g of total carbohydrates, it would be equal to 2 servings of carbohydrates.  Check the number of grams (g) of saturated and trans fats in one serving. Choose foods that have low or no amount of these fats.  Check the number of milligrams (mg) of sodium in one serving. Most people should limit total sodium intake to less than 2,300 mg per day.  Always check the nutrition information of foods labeled as "low-fat" or "nonfat". These foods may be higher in added sugar or refined carbohydrates and should be avoided.  Talk to your dietitian to identify your daily goals for nutrients listed on the label. Shopping  Avoid buying canned, premade, or processed foods. These foods tend to be high in fat, sodium, and added sugar.  Shop around the outside edge of the grocery store. This includes fresh fruits and vegetables, bulk grains, fresh meats, and fresh dairy. Cooking  Use low-heat cooking methods, such as baking, instead of high-heat cooking methods like deep frying.  Cook using healthy oils, such as olive, canola, or sunflower oil.  Avoid cooking with butter, cream, or high-fat meats. Meal planning  Eat meals and snacks regularly, preferably at the same times every day. Avoid going long periods of time without eating.  Eat foods high in fiber, such as fresh fruits, vegetables, beans, and whole grains. Talk to your dietitian about how many servings of carbohydrates you can eat at each meal.  Eat 4-6 ounces of lean protein each day, such as lean meat, chicken, fish, eggs, or tofu. 1 ounce is equal to 1 ounce of meat, chicken, or fish, 1 egg, or 1/4 cup of tofu.  Eat some  foods each day that contain healthy fats, such as avocado, nuts, seeds, and fish. Lifestyle   Check your blood glucose regularly.  Exercise at least 30 minutes 5 or more days each week, or as told by your health care provider.  Take medicines as told by your health care provider.  Do not use any products that contain nicotine or tobacco, such as cigarettes and e-cigarettes. If you need help quitting, ask your health care provider.  Work with a counselor or diabetes educator to identify strategies to manage stress and any emotional and social challenges. What are some questions to ask my health care provider?  Do I need to meet with a diabetes educator?  Do I need to meet with a dietitian?  What number can I call if I have questions?  When are the best times to check my blood glucose? Where to find more information:  American Diabetes Association: diabetes.org/food-and-fitness/food  Academy of Nutrition and Dietetics: www.eatright.org/resources/health/diseases-and-conditions/diabetes  National Institute of Diabetes and Digestive and Kidney Diseases (NIH): www.niddk.nih.gov/health-information/diabetes/overview/diet-eating-physical-activity Summary  A healthy meal plan will   help you control your blood glucose and maintain a healthy lifestyle.  Working with a diet and nutrition specialist (dietitian) can help you make a meal plan that is best for you.  Keep in mind that carbohydrates and alcohol have immediate effects on your blood glucose levels. It is important to count carbohydrates and to use alcohol carefully. This information is not intended to replace advice given to you by your health care provider. Make sure you discuss any questions you have with your health care provider. Document Released: 11/14/2004 Document Revised: 03/24/2016 Document Reviewed: 03/24/2016 Elsevier Interactive Patient Education  2018 [employer]  

## 2017-12-01 NOTE — Progress Notes (Signed)
Lab Results  Component Value Date   HGBA1C 6.8 (H) 08/19/2017   BP Readings from Last 3 Encounters:  12/01/17 116/78  08/25/17 120/90  08/19/17 124/80   Wt Readings from Last 3 Encounters:  12/01/17 215 lb 9.6 oz (97.8 kg)  08/25/17 214 lb 3.2 oz (97.2 kg)  08/19/17 214 lb (97.1 kg)   Lab Results  Component Value Date   CHOL 254 (H) 08/19/2017   HDL 38 (L) 08/19/2017   LDLCALC 155 (H) 08/19/2017   LDLDIRECT 164.0 08/28/2015   TRIG 305 (H) 08/19/2017   CHOLHDL 6.7 (H) 08/19/2017    Connor Tate 47 y.o.   Chief Complaint  Patient presents with  . Diabetes    and cholesterol follow up    HISTORY OF PRESENT ILLNESS: This is a 47 y.o. male here for follow-up of diabetes and dyslipidemia. Doing well has no complaints.  Adjusting his diet and eating better.  Blood pressure has been normal. HPI   Prior to Admission medications   Medication Sig Start Date End Date Taking? Authorizing Provider  Cetirizine HCl (ZYRTEC PO) Take by mouth daily.   Yes [provider]  metFORMIN (GLUCOPHAGE) 500 MG tablet Take 1 tablet (500 mg total) by mouth 2 (two) times daily with a meal. 08/25/17  Yes Vermell Madrid, Ines Bloomer, MD  Omeprazole Magnesium (PRILOSEC OTC PO) Take by mouth daily.   Yes [provider]  pravastatin (PRAVACHOL) 40 MG tablet Take 1 tablet (40 mg total) by mouth daily. 08/21/17  Yes Horald Pollen, MD    Allergies  Allergen Reactions  . Sulfa Antibiotics     Itchy and burning palms    Patient Active Problem List   Diagnosis Date Noted  . Dyslipidemia 08/25/2017  . Type 2 diabetes mellitus without complication, without long-term current use of insulin ([place]) 08/28/2015  . Essential hypertension 08/28/2015  . Wolff-Parkinson-White (WPW) syndrome, type A 08/28/2015  . 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 Routine general medical examination at a health care facility 08/03/2014  . OSA on CPAP 08/03/2014    Past Medical History:    Diagnosis Date  . Allergy   . Atypical chest pain   . L4-L5 disc bulge   . Right bundle branch block   . Snoring   . Wolff-Parkinson-White (WPW) syndrome     Past Surgical History:  Procedure Laterality Date  . NO PAST SURGERIES    . SHOULDER SURGERY Bilateral    7-8 year ago    Social History   Socioeconomic History  . Marital status: Married    Spouse name: Not on file  . Number of children: Not on file  . Years of education: Not on file  . Highest education level: Not on file  Occupational History  . Not on file  Social Needs  . Financial resource strain: Not on file  . Food insecurity:    Worry: Not on file    Inability: Not on file  . Transportation needs:    Medical: Not on file    Non-medical: Not on file  Tobacco Use  . Smoking status: Never Smoker  . Smokeless tobacco: Never Used  Substance and Sexual Activity  . Alcohol use: Yes    Alcohol/week: 1.0 standard drinks    Types: 1 Cans of beer per week  . Drug use: No  . Sexual activity: Yes    Partners: Female  Lifestyle  . Physical activity:    Days per week: Not on file  Minutes per session: Not on file  . Stress: Not on file  Relationships  . Social connections:    Talks on phone: Not on file    Gets together: Not on file    Attends religious service: Not on file    Active member of club or organization: Not on file    Attends meetings of clubs or organizations: Not on file    Relationship status: Not on file  . Intimate partner violence:    Fear of current or ex partner: Not on file    Emotionally abused: Not on file    Physically abused: Not on file    Forced sexual activity: Not on file  Other Topics Concern  . Not on file  Social History Narrative  . Not on file    Family History  Problem Relation Age of Onset  . Heart disease Mother   . Alcohol abuse Neg Hx   . Cancer Neg Hx   . COPD Neg Hx   . Depression Neg Hx   . Diabetes Neg Hx   . Drug abuse Neg Hx   . Early death  Neg Hx   . Hearing loss Neg Hx   . Hyperlipidemia Neg Hx   . Hypertension Neg Hx   . Kidney disease Neg Hx   . Stroke Neg Hx      Review of Systems  Constitutional: Negative.  Negative for chills, fever and weight loss.  HENT: Negative.  Negative for congestion and sore throat.   Eyes: Negative.  Negative for blurred vision and double vision.  Respiratory: Negative.  Negative for cough and shortness of breath.   Cardiovascular: Negative.  Negative for chest pain.  Gastrointestinal: Negative.  Negative for abdominal pain, diarrhea, nausea and vomiting.  Genitourinary: Negative.  Negative for dysuria.  Musculoskeletal: Negative.  Negative for back pain, myalgias and neck pain.  Skin: Negative.  Negative for rash.  Neurological: Negative.  Negative for dizziness and headaches.  Endo/Heme/Allergies: Negative.   All other systems reviewed and are negative.   Vitals:   12/01/17 0856  BP: 116/78  Pulse: 76  Resp: 16  Temp: 98.6 F (37 C)  SpO2: 96%    Physical Exam  Constitutional: He is oriented to person, place, and time. He appears well-developed and well-nourished.  HENT:  Head: Normocephalic and atraumatic.  Nose: Nose normal.  Mouth/Throat: Oropharynx is clear and moist.  Eyes: Pupils are equal, round, and reactive to light. Conjunctivae and EOM are normal.  Neck: Normal range of motion. Neck supple. No JVD present.  Cardiovascular: Normal rate, regular rhythm and normal heart sounds.  Pulmonary/Chest: Effort normal and breath sounds normal.  Musculoskeletal: Normal range of motion. He exhibits no edema.  Lymphadenopathy:    He has no cervical adenopathy.  Neurological: He is alert and oriented to person, place, and time. No sensory deficit. He exhibits normal muscle tone.  Skin: Skin is warm and dry. Capillary refill takes less than 2 seconds.  Psychiatric: He has a normal mood and affect. His behavior is normal.  Vitals reviewed.  Results for orders placed or  performed in visit on 12/01/17 (from the past 24 hour(s))  POCT glucose (manual entry)     Status: Abnormal   Collection Time: 12/01/17  9:22 AM  Result Value Ref Range   POC Glucose 130 (A) 70 - 99 mg/dl  POCT glycosylated hemoglobin (Hb A1C)     Status: Abnormal   Collection Time: 12/01/17  9:28 AM  Result  Value Ref Range   Hemoglobin A1C 6.8 (A) 4.0 - 5.6 %   HbA1c POC (<> result, manual entry)     HbA1c, POC (prediabetic range)     HbA1c, POC (controlled diabetic range)     Type 2 diabetes mellitus without complication, without long-term current use of insulin ([facility]) Hemoglobin A1c still elevated at 6.8.  Patient just started taking metformin twice a day.  Was only taking it once a day to get used to with diarrhea.  Also just started making real adjustments in his diet.  Plan is to continue present treatment with metformin twice a day, diet and exercise with follow-up in 3 months.  Dyslipidemia Labs were done today but patient is not fasting.  Continue pravastatin 40 mg a day.    ASSESSMENT & PLAN: Semisi was seen today for diabetes.  Diagnoses and all orders for this visit:  Type 2 diabetes mellitus without complication, without long-term current use of insulin (HCC) -     HM Diabetes Foot Exam -     Microalbumin, urine -     Comprehensive metabolic panel -     Lipid panel -     POCT glucose (manual entry) -     POCT glycosylated hemoglobin (Hb A1C)  Dyslipidemia -     Microalbumin, urine -     Comprehensive metabolic panel -     Lipid panel -     POCT glucose (manual entry) -     POCT glycosylated hemoglobin (Hb A1C)    Patient Instructions       If you have lab work done today you will be contacted with your lab results within the next 2 weeks.  If you have not heard from [place] then please contact us. The fastest way to get your results is to register for My Chart.   IF you received an x-ray today, you will receive an invoice from [facility]. Please  contact [facility] at [phone] with questions or concerns regarding your invoice.   IF you received labwork today, you will receive an invoice from [facility]. Please contact [facility] at [phone] with questions or concerns regarding your invoice.   Our billing staff will not be able to assist you with questions regarding bills from these companies.  You will be contacted with the lab results as soon as they are available. The fastest way to get your results is to activate your My Chart account. Instructions are located on the last page of this paperwork. If you have not heard from [place] regarding the results in 2 weeks, please contact this office.     Diabetes Mellitus and Nutrition When you have diabetes (diabetes mellitus), it is very important to have healthy eating habits because your blood sugar (glucose) levels are greatly affected by what you eat and drink. Eating healthy foods in the appropriate amounts, at about the same times every day, can help you:  Control your blood glucose.  Lower your risk of heart disease.  Improve your blood pressure.  Reach or maintain a healthy weight.  Every person with diabetes is different, and each person has different needs for a meal plan. Your health care provider may recommend that you work with a diet and nutrition specialist (dietitian) to make a meal plan that is best for you. Your meal plan may vary depending on factors such as:  The calories you need.  The medicines you take.  Your weight.  Your blood glucose, blood pressure, and cholesterol levels.  Your activity level.  Other health conditions you have, such as heart or kidney disease.  How do carbohydrates affect me? Carbohydrates affect your blood glucose level more than any other type of food. Eating carbohydrates naturally increases the amount of glucose in your blood. Carbohydrate counting is a method for keeping track of how many carbohydrates you eat. Counting  carbohydrates is important to keep your blood glucose at a healthy level, especially if you use insulin or take certain oral diabetes medicines. It is important to know how many carbohydrates you can safely have in each meal. This is different for every person. Your dietitian can help you calculate how many carbohydrates you should have at each meal and for snack. Foods that contain carbohydrates include:  Bread, cereal, rice, pasta, and crackers.  Potatoes and corn.  Peas, beans, and lentils.  Milk and yogurt.  Fruit and juice.  Desserts, such as cakes, cookies, ice cream, and candy.  How does alcohol affect me? Alcohol can cause a sudden decrease in blood glucose (hypoglycemia), especially if you use insulin or take certain oral diabetes medicines. Hypoglycemia can be a life-threatening condition. Symptoms of hypoglycemia (sleepiness, dizziness, and confusion) are similar to symptoms of having too much alcohol. If your health care provider says that alcohol is safe for you, follow these guidelines:  Limit alcohol intake to no more than 1 drink per day for nonpregnant women and 2 drinks per day for men. One drink equals 12 oz of beer, 5 oz of wine, or 1 oz of hard liquor.  Do not drink on an empty stomach.  Keep yourself hydrated with water, diet soda, or unsweetened iced tea.  Keep in mind that regular soda, juice, and other mixers may contain a lot of sugar and must be counted as carbohydrates.  What are tips for following this plan? Reading food labels  Start by checking the serving size on the label. The amount of calories, carbohydrates, fats, and other nutrients listed on the label are based on one serving of the food. Many foods contain more than one serving per package.  Check the total grams (g) of carbohydrates in one serving. You can calculate the number of servings of carbohydrates in one serving by dividing the total carbohydrates by 15. For example, if a food has 30 g  of total carbohydrates, it would be equal to 2 servings of carbohydrates.  Check the number of grams (g) of saturated and trans fats in one serving. Choose foods that have low or no amount of these fats.  Check the number of milligrams (mg) of sodium in one serving. Most people should limit total sodium intake to less than 2,300 mg per day.  Always check the nutrition information of foods labeled as "low-fat" or "nonfat". These foods may be higher in added sugar or refined carbohydrates and should be avoided.  Talk to your dietitian to identify your daily goals for nutrients listed on the label. Shopping  Avoid buying canned, premade, or processed foods. These foods tend to be high in fat, sodium, and added sugar.  Shop around the outside edge of the grocery store. This includes fresh fruits and vegetables, bulk grains, fresh meats, and fresh dairy. Cooking  Use low-heat cooking methods, such as baking, instead of high-heat cooking methods like deep frying.  Cook using healthy oils, such as olive, canola, or sunflower oil.  Avoid cooking with butter, cream, or high-fat meats. Meal planning  Eat meals and snacks regularly, preferably at the same  times every day. Avoid going long periods of time without eating.  Eat foods high in fiber, such as fresh fruits, vegetables, beans, and whole grains. Talk to your dietitian about how many servings of carbohydrates you can eat at each meal.  Eat 4-6 ounces of lean protein each day, such as lean meat, chicken, fish, eggs, or tofu. 1 ounce is equal to 1 ounce of meat, chicken, or fish, 1 egg, or 1/4 cup of tofu.  Eat some foods each day that contain healthy fats, such as avocado, nuts, seeds, and fish. Lifestyle   Check your blood glucose regularly.  Exercise at least 30 minutes 5 or more days each week, or as told by your health care provider.  Take medicines as told by your health care provider.  Do not use any products that contain  nicotine or tobacco, such as cigarettes and e-cigarettes. If you need help quitting, ask your health care provider.  Work with a Social worker or diabetes educator to identify strategies to manage stress and any emotional and social challenges. What are some questions to ask my health care provider?  Do I need to meet with a diabetes educator?  Do I need to meet with a dietitian?  What number can I call if I have questions?  When are the best times to check my blood glucose? Where to find more information:  American Diabetes Association: diabetes.org/food-and-fitness/food  Academy of Nutrition and Dietetics: PokerClues.dk  [employer] of Diabetes and Digestive and Kidney Diseases (NIH): ContactWire.be Summary  A healthy meal plan will help you control your blood glucose and maintain a healthy lifestyle.  Working with a diet and nutrition specialist (dietitian) can help you make a meal plan that is best for you.  Keep in mind that carbohydrates and alcohol have immediate effects on your blood glucose levels. It is important to count carbohydrates and to use alcohol carefully. This information is not intended to replace advice given to you by your health care provider. Make sure you discuss any questions you have with your health care provider. Document Released: 11/14/2004 Document Revised: 03/24/2016 Document Reviewed: 03/24/2016 Elsevier Interactive Patient Education  2018 [employer].      Agustina Caroli, MD [facility]

## 2017-12-01 NOTE — Assessment & Plan Note (Signed)
Hemoglobin A1c still elevated at 6.8.  Patient just started taking metformin twice a day.  Was only taking it once a day to get used to with diarrhea.  Also just started making real adjustments in his diet.  Plan is to continue present treatment with metformin twice a day, diet and exercise with follow-up in 3 months.

## 2017-12-01 NOTE — Assessment & Plan Note (Signed)
Labs were done today but patient is not fasting.  Continue pravastatin 40 mg a day.

## 2017-12-02 ENCOUNTER — Encounter: Payer: Self-pay | Admitting: Radiology

## 2017-12-02 LAB — MICROALBUMIN, URINE: Microalbumin, Urine: 7.4 ug/mL

## 2018-03-09 ENCOUNTER — Other Ambulatory Visit: Payer: Self-pay

## 2018-03-09 ENCOUNTER — Encounter: Payer: Self-pay | Admitting: Emergency Medicine

## 2018-03-09 ENCOUNTER — Ambulatory Visit: Payer: PRIVATE HEALTH INSURANCE | Admitting: Emergency Medicine

## 2018-03-09 VITALS — BP 124/86 | HR 84 | Temp 98.7°F | Resp 16 | Ht 71.0 in | Wt 216.4 lb

## 2018-03-09 DIAGNOSIS — B349 Viral infection, unspecified: Secondary | ICD-10-CM

## 2018-03-09 DIAGNOSIS — E785 Hyperlipidemia, unspecified: Secondary | ICD-10-CM

## 2018-03-09 DIAGNOSIS — E119 Type 2 diabetes mellitus without complications: Secondary | ICD-10-CM

## 2018-03-09 DIAGNOSIS — R05 Cough: Secondary | ICD-10-CM | POA: Diagnosis not present

## 2018-03-09 DIAGNOSIS — M25541 Pain in joints of right hand: Secondary | ICD-10-CM

## 2018-03-09 DIAGNOSIS — R059 Cough, unspecified: Secondary | ICD-10-CM

## 2018-03-09 DIAGNOSIS — M25542 Pain in joints of left hand: Secondary | ICD-10-CM

## 2018-03-09 NOTE — Patient Instructions (Addendum)
   If you have lab work done today you will be contacted with your lab results within the next 2 weeks.  If you have not heard from us then please contact us. The fastest way to get your results is to register for My Chart.   IF you received an x-ray today, you will receive an invoice from [facility]. Please contact [facility] at [phone] with questions or concerns regarding your invoice.   IF you received labwork today, you will receive an invoice from [facility]. Please contact [facility] at [phone] with questions or concerns regarding your invoice.   Our billing staff will not be able to assist you with questions regarding bills from these companies.  You will be contacted with the lab results as soon as they are available. The fastest way to get your results is to activate your My Chart account. Instructions are located on the last page of this paperwork. If you have not heard from us regarding the results in 2 weeks, please contact this office.     Diabetes Mellitus and Nutrition, Adult When you have diabetes (diabetes mellitus), it is very important to have healthy eating habits because your blood sugar (glucose) levels are greatly affected by what you eat and drink. Eating healthy foods in the appropriate amounts, at about the same times every day, can help you:  Control your blood glucose.  Lower your risk of heart disease.  Improve your blood pressure.  Reach or maintain a healthy weight. Every person with diabetes is different, and each person has different needs for a meal plan. Your health care provider may recommend that you work with a diet and nutrition specialist (dietitian) to make a meal plan that is best for you. Your meal plan may vary depending on factors such as:  The calories you need.  The medicines you take.  Your weight.  Your blood glucose, blood pressure, and cholesterol levels.  Your activity level.  Other health conditions  you have, such as heart or kidney disease. How do carbohydrates affect me? Carbohydrates, also called carbs, affect your blood glucose level more than any other type of food. Eating carbs naturally raises the amount of glucose in your blood. Carb counting is a method for keeping track of how many carbs you eat. Counting carbs is important to keep your blood glucose at a healthy level, especially if you use insulin or take certain oral diabetes medicines. It is important to know how many carbs you can safely have in each meal. This is different for every person. Your dietitian can help you calculate how many carbs you should have at each meal and for each snack. Foods that contain carbs include:  Bread, cereal, rice, pasta, and crackers.  Potatoes and corn.  Peas, beans, and lentils.  Milk and yogurt.  Fruit and juice.  Desserts, such as cakes, cookies, ice cream, and candy. How does alcohol affect me? Alcohol can cause a sudden decrease in blood glucose (hypoglycemia), especially if you use insulin or take certain oral diabetes medicines. Hypoglycemia can be a life-threatening condition. Symptoms of hypoglycemia (sleepiness, dizziness, and confusion) are similar to symptoms of having too much alcohol. If your health care provider says that alcohol is safe for you, follow these guidelines:  Limit alcohol intake to no more than 1 drink per day for nonpregnant women and 2 drinks per day for men. One drink equals 12 oz of beer, 5 oz of wine, or 1 oz of hard liquor.    Do not drink on an empty stomach.  Keep yourself hydrated with water, diet soda, or unsweetened iced tea.  Keep in mind that regular soda, juice, and other mixers may contain a lot of sugar and must be counted as carbs. What are tips for following this plan?  Reading food labels  Start by checking the serving size on the "Nutrition Facts" label of packaged foods and drinks. The amount of calories, carbs, fats, and other  nutrients listed on the label is based on one serving of the item. Many items contain more than one serving per package.  Check the total grams (g) of carbs in one serving. You can calculate the number of servings of carbs in one serving by dividing the total carbs by 15. For example, if a food has 30 g of total carbs, it would be equal to 2 servings of carbs.  Check the number of grams (g) of saturated and trans fats in one serving. Choose foods that have low or no amount of these fats.  Check the number of milligrams (mg) of salt (sodium) in one serving. Most people should limit total sodium intake to less than 2,300 mg per day.  Always check the nutrition information of foods labeled as "low-fat" or "nonfat". These foods may be higher in added sugar or refined carbs and should be avoided.  Talk to your dietitian to identify your daily goals for nutrients listed on the label. Shopping  Avoid buying canned, premade, or processed foods. These foods tend to be high in fat, sodium, and added sugar.  Shop around the outside edge of the grocery store. This includes fresh fruits and vegetables, bulk grains, fresh meats, and fresh dairy. Cooking  Use low-heat cooking methods, such as baking, instead of high-heat cooking methods like deep frying.  Cook using healthy oils, such as olive, canola, or sunflower oil.  Avoid cooking with butter, cream, or high-fat meats. Meal planning  Eat meals and snacks regularly, preferably at the same times every day. Avoid going long periods of time without eating.  Eat foods high in fiber, such as fresh fruits, vegetables, beans, and whole grains. Talk to your dietitian about how many servings of carbs you can eat at each meal.  Eat 4-6 ounces (oz) of lean protein each day, such as lean meat, chicken, fish, eggs, or tofu. One oz of lean protein is equal to: ? 1 oz of meat, chicken, or fish. ? 1 egg. ?  cup of tofu.  Eat some foods each day that contain  healthy fats, such as avocado, nuts, seeds, and fish. Lifestyle  Check your blood glucose regularly.  Exercise regularly as told by your health care provider. This may include: ? 150 minutes of moderate-intensity or vigorous-intensity exercise each week. This could be brisk walking, biking, or water aerobics. ? Stretching and doing strength exercises, such as yoga or weightlifting, at least 2 times a week.  Take medicines as told by your health care provider.  Do not use any products that contain nicotine or tobacco, such as cigarettes and e-cigarettes. If you need help quitting, ask your health care provider.  Work with a counselor or diabetes educator to identify strategies to manage stress and any emotional and social challenges. Questions to ask a health care provider  Do I need to meet with a diabetes educator?  Do I need to meet with a dietitian?  What number can I call if I have questions?  When are the best times to   check my blood glucose? Where to find more information:  American Diabetes Association: diabetes.org  Academy of Nutrition and Dietetics: www.eatright.org  National Institute of Diabetes and Digestive and Kidney Diseases (NIH): www.niddk.nih.gov Summary  A healthy meal plan will help you control your blood glucose and maintain a healthy lifestyle.  Working with a diet and nutrition specialist (dietitian) can help you make a meal plan that is best for you.  Keep in mind that carbohydrates (carbs) and alcohol have immediate effects on your blood glucose levels. It is important to count carbs and to use alcohol carefully. This information is not intended to replace advice given to you by your health care provider. Make sure you discuss any questions you have with your health care provider. Document Released: 11/14/2004 Document Revised: 09/17/2016 Document Reviewed: 03/24/2016 Elsevier Interactive Patient Education  2019 Elsevier Inc.  

## 2018-03-09 NOTE — Progress Notes (Signed)
Lab Results  Component Value Date   HGBA1C 6.8 (A) 12/01/2017   BP Readings from Last 3 Encounters:  03/09/18 124/86  12/01/17 116/78  08/25/17 120/90   Connor Tate 48 y.o.   Chief Complaint  Patient presents with  . Diabetes    follow up and patient wants to discuss wheezing with cough x 2 months    HISTORY OF PRESENT ILLNESS: This is a 48 y.o. male here for follow-up of diabetes.  Taking metformin twice a day without any problems.  Also has a history of dyslipidemia, taking pravastatin 40 mg a day.  No side effects.  Tolerating medications well. Also complaining of lingering cough with mild intermittent wheezing that started with flulike symptoms 2 months ago.  Feeling better today.  Also has developed some joint pains to both hands.  Has some general weakness with mild fatigue.  Probably related to URI.  No other significant symptoms.  Has a history of reflux disease and takes omeprazole for this. Otherwise doing well with no other complaints or medical concerns today.  HPI   Prior to Admission medications   Medication Sig Start Date End Date Taking? Authorizing Provider  Cetirizine HCl (ZYRTEC PO) Take by mouth daily.   Yes [provider]  metFORMIN (GLUCOPHAGE) 500 MG tablet Take 1 tablet (500 mg total) by mouth 2 (two) times daily with a meal. 08/25/17  Yes Ritha Sampedro, Ines Bloomer, MD  Omeprazole Magnesium (PRILOSEC OTC PO) Take by mouth daily.   Yes [provider]  pravastatin (PRAVACHOL) 40 MG tablet Take 1 tablet (40 mg total) by mouth daily. 08/21/17  Yes Horald Pollen, MD    Allergies  Allergen Reactions  . Sulfa Antibiotics     Itchy and burning palms    Patient Active Problem List   Diagnosis Date Noted  . Dyslipidemia 08/25/2017  . Type 2 diabetes mellitus without complication, without long-term current use of insulin ([place]) 08/28/2015  . Essential hypertension 08/28/2015  . Wolff-Parkinson-White (WPW) syndrome, type A 08/28/2015    . 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 OSA on CPAP 08/03/2014    Past Medical History:  Diagnosis Date  . Allergy   . Atypical chest pain   . L4-L5 disc bulge   . Right bundle branch block   . Snoring   . Wolff-Parkinson-White (WPW) syndrome     Past Surgical History:  Procedure Laterality Date  . NO PAST SURGERIES    . SHOULDER SURGERY Bilateral    7-8 year ago    Social History   Socioeconomic History  . Marital status: Married    Spouse name: Not on file  . Number of children: Not on file  . Years of education: Not on file  . Highest education level: Not on file  Occupational History  . Not on file  Social Needs  . Financial resource strain: Not on file  . Food insecurity:    Worry: Not on file    Inability: Not on file  . Transportation needs:    Medical: Not on file    Non-medical: Not on file  Tobacco Use  . Smoking status: Never Smoker  . Smokeless tobacco: Never Used  Substance and Sexual Activity  . Alcohol use: Yes    Alcohol/week: 1.0 standard drinks    Types: 1 Cans of beer per week  . Drug use: No  . Sexual activity: Yes    Partners: Female  Lifestyle  . Physical activity:    Days  per week: Not on file    Minutes per session: Not on file  . Stress: Not on file  Relationships  . Social connections:    Talks on phone: Not on file    Gets together: Not on file    Attends religious service: Not on file    Active member of club or organization: Not on file    Attends meetings of clubs or organizations: Not on file    Relationship status: Not on file  . Intimate partner violence:    Fear of current or ex partner: Not on file    Emotionally abused: Not on file    Physically abused: Not on file    Forced sexual activity: Not on file  Other Topics Concern  . Not on file  Social History Narrative  . Not on file    Family History  Problem Relation Age of Onset  . Heart disease Mother   . Alcohol abuse Neg Hx   . Cancer Neg Hx   .  COPD Neg Hx   . Depression Neg Hx   . Diabetes Neg Hx   . Drug abuse Neg Hx   . Early death Neg Hx   . Hearing loss Neg Hx   . Hyperlipidemia Neg Hx   . Hypertension Neg Hx   . Kidney disease Neg Hx   . Stroke Neg Hx      Review of Systems  Constitutional: Negative.  Negative for chills and fever.  HENT: Negative.  Negative for congestion, hearing loss and sore throat.   Eyes: Negative.  Negative for blurred vision and double vision.  Respiratory: Positive for cough and wheezing.   Cardiovascular: Negative.  Negative for chest pain, palpitations and leg swelling.  Gastrointestinal: Negative.  Negative for abdominal pain, blood in stool, diarrhea, melena, nausea and vomiting.  Genitourinary: Negative.   Musculoskeletal: Positive for joint pain.  Skin: Negative.  Negative for rash.  Neurological: Negative.  Negative for dizziness and headaches.  Endo/Heme/Allergies: Negative.   All other systems reviewed and are negative.  Vitals:   03/09/18 0856  BP: 124/86  Pulse: 84  Resp: 16  Temp: 98.7 F (37.1 C)  SpO2: 96%     Physical Exam Vitals signs reviewed.  Constitutional:      Appearance: Normal appearance.  HENT:     Head: Normocephalic and atraumatic.     Nose: Nose normal.     Mouth/Throat:     Mouth: Mucous membranes are moist.     Pharynx: Oropharynx is clear.  Eyes:     Extraocular Movements: Extraocular movements intact.     Conjunctiva/sclera: Conjunctivae normal.     Pupils: Pupils are equal, round, and reactive to light.  Neck:     Musculoskeletal: Normal range of motion.  Cardiovascular:     Rate and Rhythm: Normal rate and regular rhythm.     Heart sounds: Normal heart sounds.  Pulmonary:     Effort: Pulmonary effort is normal.     Breath sounds: Normal breath sounds.  Abdominal:     Palpations: Abdomen is soft.     Tenderness: There is no abdominal tenderness.  Musculoskeletal: Normal range of motion.  Skin:    General: Skin is warm and dry.       Capillary Refill: Capillary refill takes less than 2 seconds.  Neurological:     General: No focal deficit present.     Mental Status: He is alert and oriented to person, place, and time.  Sensory: No sensory deficit.     Motor: No weakness.  Psychiatric:        Mood and Affect: Mood normal.        Behavior: Behavior normal.      ASSESSMENT & PLAN: Connor Tate was seen today for diabetes.  Diagnoses and all orders for this visit:  Type 2 diabetes mellitus without complication, without long-term current use of insulin (H[facility]) -     Comprehensive metabolic panel -     Hemoglobin A1c  Dyslipidemia -     Lipid panel  Cough  Viral illness -     CBC with Differential/Platelet  Arthralgia of hands, bilateral -     CBC with Differential/Platelet    Patient Instructions       If you have lab work done today you will be contacted with your lab results within the next 2 weeks.  If you have not heard from [place] then please contact us. The fastest way to get your results is to register for My Chart.   IF you received an x-ray today, you will receive an invoice from [facility]. Please contact [facility] at [phone] with questions or concerns regarding your invoice.   IF you received labwork today, you will receive an invoice from [place]. Please contact [facility] at [phone] with questions or concerns regarding your invoice.   Our billing staff will not be able to assist you with questions regarding bills from these companies.  You will be contacted with the lab results as soon as they are available. The fastest way to get your results is to activate your My Chart account. Instructions are located on the last page of this paperwork. If you have not heard from [place] regarding the results in 2 weeks, please contact this office.     Diabetes Mellitus and Nutrition, Adult When you have diabetes (diabetes mellitus), it is very important to have healthy eating  habits because your blood sugar (glucose) levels are greatly affected by what you eat and drink. Eating healthy foods in the appropriate amounts, at about the same times every day, can help you:  Control your blood glucose.  Lower your risk of heart disease.  Improve your blood pressure.  Reach or maintain a healthy weight. Every person with diabetes is different, and each person has different needs for a meal plan. Your health care provider may recommend that you work with a diet and nutrition specialist (dietitian) to make a meal plan that is best for you. Your meal plan may vary depending on factors such as:  The calories you need.  The medicines you take.  Your weight.  Your blood glucose, blood pressure, and cholesterol levels.  Your activity level.  Other health conditions you have, such as heart or kidney disease. How do carbohydrates affect me? Carbohydrates, also called carbs, affect your blood glucose level more than any other type of food. Eating carbs naturally raises the amount of glucose in your blood. Carb counting is a method for keeping track of how many carbs you eat. Counting carbs is important to keep your blood glucose at a healthy level, especially if you use insulin or take certain oral diabetes medicines. It is important to know how many carbs you can safely have in each meal. This is different for every person. Your dietitian can help you calculate how many carbs you should have at each meal and for each snack. Foods that contain carbs include:  Bread, cereal, rice, pasta, and crackers.  Potatoes and corn.  Peas, beans, and lentils.  Milk and yogurt.  Fruit and juice.  Desserts, such as cakes, cookies, ice cream, and candy. How does alcohol affect me? Alcohol can cause a sudden decrease in blood glucose (hypoglycemia), especially if you use insulin or take certain oral diabetes medicines. Hypoglycemia can be a life-threatening condition. Symptoms of  hypoglycemia (sleepiness, dizziness, and confusion) are similar to symptoms of having too much alcohol. If your health care provider says that alcohol is safe for you, follow these guidelines:  Limit alcohol intake to no more than 1 drink per day for nonpregnant women and 2 drinks per day for men. One drink equals 12 oz of beer, 5 oz of wine, or 1 oz of hard liquor.  Do not drink on an empty stomach.  Keep yourself hydrated with water, diet soda, or unsweetened iced tea.  Keep in mind that regular soda, juice, and other mixers may contain a lot of sugar and must be counted as carbs. What are tips for following this plan?  Reading food labels  Start by checking the serving size on the "Nutrition Facts" label of packaged foods and drinks. The amount of calories, carbs, fats, and other nutrients listed on the label is based on one serving of the item. Many items contain more than one serving per package.  Check the total grams (g) of carbs in one serving. You can calculate the number of servings of carbs in one serving by dividing the total carbs by 15. For example, if a food has 30 g of total carbs, it would be equal to 2 servings of carbs.  Check the number of grams (g) of saturated and trans fats in one serving. Choose foods that have low or no amount of these fats.  Check the number of milligrams (mg) of salt (sodium) in one serving. Most people should limit total sodium intake to less than 2,300 mg per day.  Always check the nutrition information of foods labeled as "low-fat" or "nonfat". These foods may be higher in added sugar or refined carbs and should be avoided.  Talk to your dietitian to identify your daily goals for nutrients listed on the label. Shopping  Avoid buying canned, premade, or processed foods. These foods tend to be high in fat, sodium, and added sugar.  Shop around the outside edge of the grocery store. This includes fresh fruits and vegetables, bulk grains, fresh  meats, and fresh dairy. Cooking  Use low-heat cooking methods, such as baking, instead of high-heat cooking methods like deep frying.  Cook using healthy oils, such as olive, canola, or sunflower oil.  Avoid cooking with butter, cream, or high-fat meats. Meal planning  Eat meals and snacks regularly, preferably at the same times every day. Avoid going long periods of time without eating.  Eat foods high in fiber, such as fresh fruits, vegetables, beans, and whole grains. Talk to your dietitian about how many servings of carbs you can eat at each meal.  Eat 4-6 ounces (oz) of lean protein each day, such as lean meat, chicken, fish, eggs, or tofu. One oz of lean protein is equal to: ? 1 oz of meat, chicken, or fish. ? 1 egg. ?  cup of tofu.  Eat some foods each day that contain healthy fats, such as avocado, nuts, seeds, and fish. Lifestyle  Check your blood glucose regularly.  Exercise regularly as told by your health care provider. This may include: ? 150 minutes of moderate-intensity or  vigorous-intensity exercise each week. This could be brisk walking, biking, or water aerobics. ? Stretching and doing strength exercises, such as yoga or weightlifting, at least 2 times a week.  Take medicines as told by your health care provider.  Do not use any products that contain nicotine or tobacco, such as cigarettes and e-cigarettes. If you need help quitting, ask your health care provider.  Work with a Social worker or diabetes educator to identify strategies to manage stress and any emotional and social challenges. Questions to ask a health care provider  Do I need to meet with a diabetes educator?  Do I need to meet with a dietitian?  What number can I call if I have questions?  When are the best times to check my blood glucose? Where to find more information:  American Diabetes Association: diabetes.org  Academy of Nutrition and Dietetics: www.eatright.[employer]  of Diabetes and Digestive and Kidney Diseases (NIH): [employer] Summary  A healthy meal plan will help you control your blood glucose and maintain a healthy lifestyle.  Working with a diet and nutrition specialist (dietitian) can help you make a meal plan that is best for you.  Keep in mind that carbohydrates (carbs) and alcohol have immediate effects on your blood glucose levels. It is important to count carbs and to use alcohol carefully. This information is not intended to replace advice given to you by your health care provider. Make sure you discuss any questions you have with your health care provider. Document Released: 11/14/2004 Document Revised: 09/17/2016 Document Reviewed: 03/24/2016 Elsevier Interactive Patient Education  2019 Elsevier Inc.      Agustina Caroli, MD [facility] Follow-up Just now frozen and came back

## 2018-03-10 ENCOUNTER — Encounter: Payer: Self-pay | Admitting: Emergency Medicine

## 2018-03-10 ENCOUNTER — Other Ambulatory Visit: Payer: Self-pay | Admitting: Emergency Medicine

## 2018-03-10 DIAGNOSIS — E1169 Type 2 diabetes mellitus with other specified complication: Secondary | ICD-10-CM

## 2018-03-10 DIAGNOSIS — E785 Hyperlipidemia, unspecified: Principal | ICD-10-CM

## 2018-03-10 LAB — HEMOGLOBIN A1C
Est. average glucose Bld gHb Est-mCnc: 154 mg/dL
Hgb A1c MFr Bld: 7 % — ABNORMAL HIGH (ref 4.8–5.6)

## 2018-03-10 LAB — COMPREHENSIVE METABOLIC PANEL
ALK PHOS: 102 IU/L (ref 39–117)
ALT: 30 IU/L (ref 0–44)
AST: 17 IU/L (ref 0–40)
Albumin/Globulin Ratio: 1.8 (ref 1.2–2.2)
Albumin: 4.6 g/dL (ref 3.5–5.5)
BUN/Creatinine Ratio: 15 (ref 9–20)
BUN: 15 mg/dL (ref 6–24)
Bilirubin Total: 0.3 mg/dL (ref 0.0–1.2)
CO2: 23 mmol/L (ref 20–29)
Calcium: 9.5 mg/dL (ref 8.7–10.2)
Chloride: 100 mmol/L (ref 96–106)
Creatinine, Ser: 0.98 mg/dL (ref 0.76–1.27)
GFR calc non Af Amer: 93 mL/min/{1.73_m2} (ref >59)
GFR, EST AFRICAN AMERICAN: 107 mL/min/{1.73_m2} (ref >59)
GLUCOSE: 140 mg/dL — AB (ref 65–99)
Globulin, Total: 2.5 g/dL (ref 1.5–4.5)
Potassium: 4.7 mmol/L (ref 3.5–5.2)
Sodium: 138 mmol/L (ref 134–144)
Total Protein: 7.1 g/dL (ref 6.0–8.5)

## 2018-03-10 LAB — CBC WITH DIFFERENTIAL/PLATELET
BASOS ABS: 0.1 10*3/uL (ref 0.0–0.2)
Basos: 1 %
EOS (ABSOLUTE): 0.2 10*3/uL (ref 0.0–0.4)
Eos: 3 %
HEMOGLOBIN: 17 g/dL (ref 13.0–17.7)
Hematocrit: 49.7 % (ref 37.5–51.0)
Immature Grans (Abs): 0 10*3/uL (ref 0.0–0.1)
Immature Granulocytes: 0 %
LYMPHS ABS: 2 10*3/uL (ref 0.7–3.1)
Lymphs: 25 %
MCH: 28.8 pg (ref 26.6–33.0)
MCHC: 34.2 g/dL (ref 31.5–35.7)
MCV: 84 fL (ref 79–97)
Monocytes Absolute: 0.5 10*3/uL (ref 0.1–0.9)
Monocytes: 6 %
NEUTROS PCT: 65 %
Neutrophils Absolute: 5.2 10*3/uL (ref 1.4–7.0)
Platelets: 301 10*3/uL (ref 150–450)
RBC: 5.9 x10E6/uL — AB (ref 4.14–5.80)
RDW: 13 % (ref 11.6–15.4)
WBC: 8 10*3/uL (ref 3.4–10.8)

## 2018-03-10 LAB — LIPID PANEL
Chol/HDL Ratio: 4.7 ratio (ref 0.0–5.0)
Cholesterol, Total: 205 mg/dL — ABNORMAL HIGH (ref 100–199)
HDL: 44 mg/dL (ref >39)
LDL CALC: 109 mg/dL — AB (ref 0–99)
Triglycerides: 260 mg/dL — ABNORMAL HIGH (ref 0–149)
VLDL Cholesterol Cal: 52 mg/dL — ABNORMAL HIGH (ref 5–40)

## 2018-03-10 MED ORDER — ROSUVASTATIN CALCIUM 20 MG PO TABS: 20.0000 mg | ORAL_TABLET | Freq: Every day | ORAL | 3 refills | Status: DC

## 2018-07-07 ENCOUNTER — Other Ambulatory Visit: Payer: Self-pay | Admitting: *Deleted

## 2018-07-07 DIAGNOSIS — I10 Essential (primary) hypertension: Secondary | ICD-10-CM

## 2018-07-07 DIAGNOSIS — E119 Type 2 diabetes mellitus without complications: Secondary | ICD-10-CM

## 2018-07-07 DIAGNOSIS — E785 Hyperlipidemia, unspecified: Secondary | ICD-10-CM

## 2018-07-12 ENCOUNTER — Encounter: Payer: PRIVATE HEALTH INSURANCE | Admitting: Emergency Medicine

## 2018-09-09 ENCOUNTER — Other Ambulatory Visit: Payer: Self-pay | Admitting: Emergency Medicine

## 2018-09-09 DIAGNOSIS — E119 Type 2 diabetes mellitus without complications: Secondary | ICD-10-CM

## 2018-09-09 NOTE — Telephone Encounter (Signed)
Forwarding medication refill to PCP for review. 

## 2019-07-12 ENCOUNTER — Telehealth: Payer: Self-pay | Admitting: Emergency Medicine

## 2019-07-12 DIAGNOSIS — Z0000 Encounter for general adult medical examination without abnormal findings: Secondary | ICD-10-CM

## 2019-07-12 NOTE — Addendum Note (Signed)
Addended by: Patrcia Dolly on: 07/12/2019 05:37 PM   Modules accepted: Orders

## 2019-07-12 NOTE — Telephone Encounter (Signed)
Lab For CPE on 08/22/19

## 2019-08-22 ENCOUNTER — Ambulatory Visit: Payer: PRIVATE HEALTH INSURANCE

## 2019-08-24 ENCOUNTER — Encounter: Payer: PRIVATE HEALTH INSURANCE | Admitting: Emergency Medicine

## 2019-11-15 ENCOUNTER — Encounter: Payer: PRIVATE HEALTH INSURANCE | Admitting: Emergency Medicine

## 2020-02-10 ENCOUNTER — Telehealth: Payer: Self-pay | Admitting: Emergency Medicine

## 2020-02-10 NOTE — Telephone Encounter (Signed)
provider not available / LVM for pt to call back to reschedule his CPE

## 2020-02-28 ENCOUNTER — Encounter: Payer: PRIVATE HEALTH INSURANCE | Admitting: Emergency Medicine

## 2020-06-09 ENCOUNTER — Encounter: Payer: Self-pay | Admitting: Emergency Medicine

## 2020-06-09 NOTE — Progress Notes (Addendum)
Lab Results  Component Value Date   HGBA1C 7.0 (H) 03/09/2018   BP Readings from Last 3 Encounters:  03/09/18 124/86  12/01/17 116/78  08/25/17 120/90   Lab Results  Component Value Date   CHOL 205 (H) 03/09/2018   HDL 44 03/09/2018   LDLCALC 109 (H) 03/09/2018   LDLDIRECT 164.0 08/28/2015   TRIG 260 (H) 03/09/2018   CHOLHDL 4.7 03/09/2018   Lab Results  Component Value Date   CREATININE 0.98 03/09/2018   BUN 15 03/09/2018   NA 138 03/09/2018   K 4.7 03/09/2018   CL 100 03/09/2018   CO2 23 03/09/2018   Connor Tate 50 y.o.   Chief Complaint  Patient presents with  . Annual Exam    HISTORY OF PRESENT ILLNESS: This is a 50 y.o. male here for annual exam.  Has the following chronic medical problems: 1.  Diabetes: On no medications.  Off Metformin for a long while. Last hemoglobin A1c done at work about 1 week ago was 7.6. States he is eating better.  No longer drinking sodas. 2.  Dyslipidemia: On no medications.  Not taking rosuvastatin. 3.  Obstructive sleep apnea on CPAP therapy Needs referral for colonoscopy. Blood pressure readings at home normal, 120s over 80s. Has no complaints or medical concerns today.  HPI   Prior to Admission medications   Medication Sig Start Date End Date Taking? Authorizing Provider  Cetirizine HCl (ZYRTEC PO) Take by mouth daily.   Yes [provider]  Omeprazole Magnesium (PRILOSEC OTC PO) Take by mouth daily.   Yes [provider]  metFORMIN (GLUCOPHAGE) 500 MG tablet Take 1 tablet (500 mg total) by mouth 2 (two) times daily with a meal. 09/11/18 12/10/18  Connor Tate, Connor Bloomer, MD  rosuvastatin (CRESTOR) 20 MG tablet Take 1 tablet (20 mg total) by mouth daily. 03/10/18   Connor Pollen, MD    Allergies  Allergen Reactions  . Sulfa Antibiotics     Itchy and burning palms    Patient Active Problem List   Diagnosis Date Noted  . Dyslipidemia 08/25/2017  . Type 2 diabetes mellitus without  complication, without long-term current use of insulin ([place]) 08/28/2015  . Essential hypertension 08/28/2015  . Wolff-Parkinson-White (WPW) syndrome, type A 08/28/2015  . 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 OSA on CPAP 08/03/2014    Past Medical History:  Diagnosis Date  . Allergy   . Atypical chest pain   . L4-L5 disc bulge   . Right bundle branch block   . Snoring   . Wolff-Parkinson-White (WPW) syndrome     Past Surgical History:  Procedure Laterality Date  . NO PAST SURGERIES    . SHOULDER SURGERY Bilateral    7-8 year ago    Social History   Socioeconomic History  . Marital status: Married    Spouse name: Not on file  . Number of children: Not on file  . Years of education: Not on file  . Highest education level: Not on file  Occupational History  . Not on file  Tobacco Use  . Smoking status: Never Smoker  . Smokeless tobacco: Never Used  Substance and Sexual Activity  . Alcohol use: Yes    Alcohol/week: 1.0 standard drink    Types: 1 Cans of beer per week  . Drug use: No  . Sexual activity: Yes    Partners: Female  Other Topics Concern  . Not on file  Social History Narrative  . Not on  file   Social Determinants of Health   Financial Resource Strain: Not on file  Food Insecurity: Not on file  Transportation Needs: Not on file  Physical Activity: Not on file  Stress: Not on file  Social Connections: Not on file  Intimate Partner Violence: Not on file    Family History  Problem Relation Age of Onset  . Heart disease Mother   . Alcohol abuse Neg Hx   . Cancer Neg Hx   . COPD Neg Hx   . Depression Neg Hx   . Diabetes Neg Hx   . Drug abuse Neg Hx   . Early death Neg Hx   . Hearing loss Neg Hx   . Hyperlipidemia Neg Hx   . Hypertension Neg Hx   . Kidney disease Neg Hx   . Stroke Neg Hx      Review of Systems  Constitutional: Negative.  Negative for chills and fever.  HENT: Negative.  Negative for congestion and sore throat.    Respiratory: Negative.  Negative for cough and shortness of breath.   Cardiovascular: Negative.  Negative for chest pain and palpitations.  Gastrointestinal: Negative.  Negative for abdominal pain, diarrhea, nausea and vomiting.  Genitourinary: Negative.  Negative for dysuria and hematuria.  Musculoskeletal: Positive for back pain and joint pain.  Skin: Negative.  Negative for rash.  Neurological: Negative.  Negative for dizziness and headaches.  All other systems reviewed and are negative.  Today's Vitals   06/11/20 0924  BP: (!) 136/102  Pulse: 87  Temp: 98.5 F (36.9 C)  TempSrc: Oral  SpO2: 98%  Weight: 214 lb 9.6 oz (97.3 kg)  Height: 6' (1.829 m)   Body mass index is 29.1 kg/m.   Physical Exam Vitals reviewed.  Constitutional:      Appearance: Normal appearance.  HENT:     Head: Normocephalic.     Mouth/Throat:     Mouth: Mucous membranes are moist.     Pharynx: Oropharynx is clear.  Eyes:     Extraocular Movements: Extraocular movements intact.     Conjunctiva/sclera: Conjunctivae normal.     Pupils: Pupils are equal, round, and reactive to light.  Cardiovascular:     Rate and Rhythm: Normal rate and regular rhythm.     Pulses: Normal pulses.     Heart sounds: Normal heart sounds.  Pulmonary:     Effort: Pulmonary effort is normal.     Breath sounds: Normal breath sounds.  Abdominal:     General: Bowel sounds are normal. There is no distension.     Palpations: Abdomen is soft. There is no mass.     Tenderness: There is no abdominal tenderness. There is no right CVA tenderness or left CVA tenderness.  Musculoskeletal:        General: Normal range of motion.     Cervical back: Normal range of motion and neck supple.  Skin:    General: Skin is warm and dry.     Capillary Refill: Capillary refill takes less than 2 seconds.  Neurological:     General: No focal deficit present.     Mental Status: He is alert and oriented to person, place, and time.   Psychiatric:        Mood and Affect: Mood normal.        Behavior: Behavior normal.      ASSESSMENT & PLAN:  Connor Tate was seen today for annual exam.  Diagnoses and all orders for this visit:  Routine general  medical examination at a health care facility  Dyslipidemia -     Lipid panel  Type 2 diabetes mellitus with hyperglycemia, without long-term current use of insulin (HCC) -     Comprehensive metabolic panel -     Hemoglobin A1c -     Microalbumin, urine  Wolff-Parkinson-White (WPW) syndrome, type A  OSA on CPAP  Colon cancer screening -     Ambulatory referral to Gastroenterology  Screening for deficiency anemia    Patient Instructions     Health Maintenance, Male Adopting a healthy lifestyle and getting preventive care are important in promoting health and wellness. Ask your health care provider about:  The right schedule for you to have regular tests and exams.  Things you can do on your own to prevent diseases and keep yourself healthy. What should I know about diet, weight, and exercise? Eat a healthy diet  Eat a diet that includes plenty of vegetables, fruits, low-fat dairy products, and lean protein.  Do not eat a lot of foods that are high in solid fats, added sugars, or sodium.   Maintain a healthy weight Body mass index (BMI) is a measurement that can be used to identify possible weight problems. It estimates body fat based on height and weight. Your health care provider can help determine your BMI and help you achieve or maintain a healthy weight. Get regular exercise Get regular exercise. This is one of the most important things you can do for your health. Most adults should:  Exercise for at least 150 minutes each week. The exercise should increase your heart rate and make you sweat (moderate-intensity exercise).  Do strengthening exercises at least twice a week. This is in addition to the moderate-intensity exercise.  Spend less time  sitting. Even light physical activity can be beneficial. Watch cholesterol and blood lipids Have your blood tested for lipids and cholesterol at 48 years of age, then have this test every 5 years. You may need to have your cholesterol levels checked more often if:  Your lipid or cholesterol levels are high.  You are older than 48 years of age.  You are at high risk for heart disease. What should I know about cancer screening? Many types of cancers can be detected early and may often be prevented. Depending on your health history and family history, you may need to have cancer screening at various ages. This may include screening for:  Colorectal cancer.  Prostate cancer.  Skin cancer.  Lung cancer. What should I know about heart disease, diabetes, and high blood pressure? Blood pressure and heart disease  High blood pressure causes heart disease and increases the risk of stroke. This is more likely to develop in people who have high blood pressure readings, are of African descent, or are overweight.  Talk with your health care provider about your target blood pressure readings.  Have your blood pressure checked: ? Every 3-5 years if you are 23-57 years of age. ? Every year if you are 65 years old or older.  If you are between the ages of 41 and 59 and are a current or former smoker, ask your health care provider if you should have a one-time screening for abdominal aortic aneurysm (AAA). Diabetes Have regular diabetes screenings. This checks your fasting blood sugar level. Have the screening done:  Once every three years after age 3 if you are at a normal weight and have a low risk for diabetes.  More often and at a  younger age if you are overweight or have a high risk for diabetes. What should I know about preventing infection? Hepatitis B If you have a higher risk for hepatitis B, you should be screened for this virus. Talk with your health care provider to find out if you  are at risk for hepatitis B infection. Hepatitis C Blood testing is recommended for:  Everyone born from [birth year] through 1963.  Anyone with known risk factors for hepatitis C. Sexually transmitted infections (STIs)  You should be screened each year for STIs, including gonorrhea and chlamydia, if: ? You are sexually active and are younger than 48 years of age. ? You are older than 48 years of age and your health care provider tells you that you are at risk for this type of infection. ? Your sexual activity has changed since you were last screened, and you are at increased risk for chlamydia or gonorrhea. Ask your health care provider if you are at risk.  Ask your health care provider about whether you are at high risk for HIV. Your health care provider may recommend a prescription medicine to help prevent HIV infection. If you choose to take medicine to prevent HIV, you should first get tested for HIV. You should then be tested every 3 months for as long as you are taking the medicine. Follow these instructions at home: Lifestyle  Do not use any products that contain nicotine or tobacco, such as cigarettes, e-cigarettes, and chewing tobacco. If you need help quitting, ask your health care provider.  Do not use street drugs.  Do not share needles.  Ask your health care provider for help if you need support or information about quitting drugs. Alcohol use  Do not drink alcohol if your health care provider tells you not to drink.  If you drink alcohol: ? Limit how much you have to 0-2 drinks a day. ? Be aware of how much alcohol is in your drink. In the [place] one drink equals one 12 oz bottle of beer (355 mL), one 5 oz glass of wine (148 mL), or one 1 oz glass of hard liquor (44 mL). General instructions  Schedule regular health, dental, and eye exams.  Stay current with your vaccines.  Tell your health care provider if: ? You often feel depressed. ? You have ever been abused or do  not feel safe at home. Summary  Adopting a healthy lifestyle and getting preventive care are important in promoting health and wellness.  Follow your health care provider's instructions about healthy diet, exercising, and getting tested or screened for diseases.  Follow your health care provider's instructions on monitoring your cholesterol and blood pressure. This information is not intended to replace advice given to you by your health care provider. Make sure you discuss any questions you have with your health care provider. Document Revised: 02/10/2018 Document Reviewed: 02/10/2018 Elsevier Patient Education  2021 [place], MD [facility]

## 2020-06-11 ENCOUNTER — Other Ambulatory Visit: Payer: Self-pay

## 2020-06-11 ENCOUNTER — Ambulatory Visit (INDEPENDENT_AMBULATORY_CARE_PROVIDER_SITE_OTHER): Payer: PRIVATE HEALTH INSURANCE | Admitting: Emergency Medicine

## 2020-06-11 ENCOUNTER — Other Ambulatory Visit: Payer: Self-pay | Admitting: Emergency Medicine

## 2020-06-11 VITALS — BP 128/80 | HR 87 | Temp 98.5°F | Ht 72.0 in | Wt 214.6 lb

## 2020-06-11 DIAGNOSIS — I10 Essential (primary) hypertension: Secondary | ICD-10-CM

## 2020-06-11 DIAGNOSIS — Z0000 Encounter for general adult medical examination without abnormal findings: Secondary | ICD-10-CM

## 2020-06-11 DIAGNOSIS — I456 Pre-excitation syndrome: Secondary | ICD-10-CM

## 2020-06-11 DIAGNOSIS — Z1211 Encounter for screening for malignant neoplasm of colon: Secondary | ICD-10-CM

## 2020-06-11 DIAGNOSIS — E785 Hyperlipidemia, unspecified: Secondary | ICD-10-CM

## 2020-06-11 DIAGNOSIS — I152 Hypertension secondary to endocrine disorders: Secondary | ICD-10-CM

## 2020-06-11 DIAGNOSIS — E1165 Type 2 diabetes mellitus with hyperglycemia: Secondary | ICD-10-CM

## 2020-06-11 DIAGNOSIS — G4733 Obstructive sleep apnea (adult) (pediatric): Secondary | ICD-10-CM

## 2020-06-11 DIAGNOSIS — E119 Type 2 diabetes mellitus without complications: Secondary | ICD-10-CM

## 2020-06-11 DIAGNOSIS — Z9989 Dependence on other enabling machines and devices: Secondary | ICD-10-CM

## 2020-06-11 DIAGNOSIS — Z130 Encounter for screening for diseases of the blood and blood-forming organs and certain disorders involving the immune mechanism: Secondary | ICD-10-CM

## 2020-06-11 LAB — COMPREHENSIVE METABOLIC PANEL
ALT: 23 U/L (ref 0–53)
AST: 17 U/L (ref 0–37)
Albumin: 4.4 g/dL (ref 3.5–5.2)
Alkaline Phosphatase: 93 U/L (ref 39–117)
BUN: 15 mg/dL (ref 6–23)
CO2: 26 mEq/L (ref 19–32)
Calcium: 9.3 mg/dL (ref 8.4–10.5)
Chloride: 103 mEq/L (ref 96–112)
Creatinine, Ser: 1.15 mg/dL (ref 0.40–1.50)
GFR: 75.8 mL/min (ref >60.00)
Glucose, Bld: 113 mg/dL — ABNORMAL HIGH (ref 70–99)
Potassium: 3.9 mEq/L (ref 3.5–5.1)
Sodium: 137 mEq/L (ref 135–145)
Total Bilirubin: 0.7 mg/dL (ref 0.2–1.2)
Total Protein: 7.3 g/dL (ref 6.0–8.3)

## 2020-06-11 LAB — HEMOGLOBIN A1C: Hgb A1c MFr Bld: 7.9 % — ABNORMAL HIGH (ref 4.6–6.5)

## 2020-06-11 LAB — LIPID PANEL
Cholesterol: 215 mg/dL — ABNORMAL HIGH (ref 0–200)
HDL: 44.1 mg/dL (ref >39.00)
LDL Cholesterol: 145 mg/dL — ABNORMAL HIGH (ref 0–99)
NonHDL: 170.64
Total CHOL/HDL Ratio: 5
Triglycerides: 127 mg/dL (ref 0.0–149.0)
VLDL: 25.4 mg/dL (ref 0.0–40.0)

## 2020-06-11 MED ORDER — METFORMIN HCL 500 MG PO TABS: 500.0000 mg | ORAL_TABLET | Freq: Two times a day (BID) | ORAL | 1 refills | Status: DC

## 2020-06-11 MED ORDER — ROSUVASTATIN CALCIUM 20 MG PO TABS: 20.0000 mg | ORAL_TABLET | Freq: Every day | ORAL | 3 refills | Status: DC

## 2020-06-11 NOTE — Patient Instructions (Signed)

## 2020-06-12 LAB — MICROALBUMIN, URINE: Microalb, Ur: 1.6 mg/dL

## 2020-10-04 ENCOUNTER — Encounter: Payer: Self-pay | Admitting: Gastroenterology

## 2020-10-14 ENCOUNTER — Other Ambulatory Visit: Payer: Self-pay | Admitting: Emergency Medicine

## 2020-10-14 DIAGNOSIS — E785 Hyperlipidemia, unspecified: Secondary | ICD-10-CM

## 2020-10-24 ENCOUNTER — Ambulatory Visit (AMBULATORY_SURGERY_CENTER): Payer: Self-pay

## 2020-10-24 ENCOUNTER — Other Ambulatory Visit: Payer: Self-pay

## 2020-10-24 VITALS — Ht 72.0 in | Wt 208.0 lb

## 2020-10-24 DIAGNOSIS — Z1211 Encounter for screening for malignant neoplasm of colon: Secondary | ICD-10-CM

## 2020-10-24 NOTE — Progress Notes (Signed)
No egg or soy allergy known to patient  No issues with past sedation with any surgeries or procedures Patient denies ever being told they had issues or difficulty with intubation  No FH of Malignant Hyperthermia No diet pills per patient No home 02 use per patient  No blood thinners per patient  Pt denies issues with constipation  No A fib or A flutter  EMMI video via MyChart  COVID 19 guidelines implemented in PV today with Pt and RN   Pt is fully vaccinated for Covid x 2;  NO PA's for preps discussed with pt in PV today  Discussed with pt there will be an out-of-pocket cost for prep and that varies from $0 to 70 +  dollars   Due to the COVID-19 pandemic we are asking patients to follow certain guidelines.  Pt aware of COVID protocols and [facility] guidelines

## 2020-11-09 ENCOUNTER — Encounter: Payer: Self-pay | Admitting: Gastroenterology

## 2020-11-19 ENCOUNTER — Encounter: Payer: Self-pay | Admitting: Certified Registered Nurse Anesthetist

## 2020-11-20 ENCOUNTER — Encounter: Payer: Self-pay | Admitting: Gastroenterology

## 2020-11-20 ENCOUNTER — Other Ambulatory Visit: Payer: Self-pay

## 2020-11-20 ENCOUNTER — Ambulatory Visit (AMBULATORY_SURGERY_CENTER): Payer: No Typology Code available for payment source | Admitting: Gastroenterology

## 2020-11-20 VITALS — BP 133/79 | HR 80 | Temp 97.7°F | Resp 19 | Ht 72.0 in | Wt 208.0 lb

## 2020-11-20 DIAGNOSIS — D123 Benign neoplasm of transverse colon: Secondary | ICD-10-CM

## 2020-11-20 DIAGNOSIS — D124 Benign neoplasm of descending colon: Secondary | ICD-10-CM

## 2020-11-20 DIAGNOSIS — D128 Benign neoplasm of rectum: Secondary | ICD-10-CM

## 2020-11-20 DIAGNOSIS — Z1211 Encounter for screening for malignant neoplasm of colon: Secondary | ICD-10-CM

## 2020-11-20 DIAGNOSIS — D127 Benign neoplasm of rectosigmoid junction: Secondary | ICD-10-CM | POA: Diagnosis not present

## 2020-11-20 MED ORDER — SODIUM CHLORIDE 0.9 % IV SOLN: 500.0000 mL | Freq: Once | INTRAVENOUS | Status: DC

## 2020-11-20 NOTE — Progress Notes (Signed)
GASTROENTEROLOGY PROCEDURE H&P NOTE   Primary Care Physician: Horald Pollen, MD  HPI: Connor Tate is a 50 y.o. male who presents for Colonoscopy for screening.  Past Medical History:  Diagnosis Date   Atypical chest pain    Diabetes mellitus without complication (HCC)    pre-DM- on meds for prevention   GERD (gastroesophageal reflux disease)    on meds   Hyperlipidemia    - with diet change   L4-L5 disc bulge    Right bundle branch block    Seasonal allergies    Sleep apnea    does not use CPAP   Snoring    Wolff-Parkinson-White (WPW) syndrome    Past Surgical History:  Procedure Laterality Date   SHOULDER ARTHROSCOPY WITH ROTATOR CUFF REPAIR AND OPEN BICEPS TENODESIS Bilateral 2012   and labrum repair   WISDOM TOOTH EXTRACTION     Current Outpatient Medications  Medication Sig Dispense Refill   Cetirizine HCl (ZYRTEC PO) Take by mouth daily.     metFORMIN (GLUCOPHAGE) 500 MG tablet Take 1 tablet (500 mg total) by mouth 2 (two) times daily with a meal. 180 tablet 1   Omeprazole Magnesium (PRILOSEC OTC PO) Take by mouth daily.     rosuvastatin (CRESTOR) 20 MG tablet Take 20 mg by mouth daily.     Current Facility-Administered Medications  Medication Dose Route Frequency Provider Last Rate Last Admin   0.9 %  sodium chloride infusion  500 mL Intravenous Once Mansouraty, Telford Nab., MD        Current Outpatient Medications:    Cetirizine HCl (ZYRTEC PO), Take by mouth daily., Disp: , Rfl:    metFORMIN (GLUCOPHAGE) 500 MG tablet, Take 1 tablet (500 mg total) by mouth 2 (two) times daily with a meal., Disp: 180 tablet, Rfl: 1   Omeprazole Magnesium (PRILOSEC OTC PO), Take by mouth daily., Disp: , Rfl:    rosuvastatin (CRESTOR) 20 MG tablet, Take 20 mg by mouth daily., Disp: , Rfl:   Current Facility-Administered Medications:    0.9 %  sodium chloride infusion, 500 mL, Intravenous, Once, Mansouraty, Telford Nab., MD Allergies  Allergen Reactions   Sulfa  Antibiotics Rash    Itchy and burning palms   Sulfasalazine Rash    Itchy and burning palms   Family History  Problem Relation Age of Onset   Heart disease Mother    Alcohol abuse Neg Hx    Cancer Neg Hx    COPD Neg Hx    Depression Neg Hx    Diabetes Neg Hx    Drug abuse Neg Hx    Early death Neg Hx    Hearing loss Neg Hx    Hyperlipidemia Neg Hx    Hypertension Neg Hx    Kidney disease Neg Hx    Stroke Neg Hx    Colon polyps Neg Hx    Colon cancer Neg Hx    Rectal cancer Neg Hx    Stomach cancer Neg Hx    Social History   Socioeconomic History   Marital status: Married    Spouse name: Not on file   Number of children: Not on file   Years of education: Not on file   Highest education level: Not on file  Occupational History   Not on file  Tobacco Use   Smoking status: Never   Smokeless tobacco: Never  Vaping Use   Vaping Use: Never used  Substance and Sexual Activity   Alcohol use: Yes  Alcohol/week: 12.0 standard drinks    Types: 12 Standard drinks or equivalent per week   Drug use: No   Sexual activity: Yes    Partners: Female  Other Topics Concern   Not on file  Social History Narrative   Not on file   Social Determinants of Health   Financial Resource Strain: Not on file  Food Insecurity: Not on file  Transportation Needs: Not on file  Physical Activity: Not on file  Stress: Not on file  Social Connections: Not on file  Intimate Partner Violence: Not on file    Physical Exam: Today's Vitals   11/20/20 0948 11/20/20 0949 11/20/20 0959  BP: (!) 124/91  (!) 142/93  Pulse: 79  85  Resp:   19  Temp: 97.7 F (36.5 C) 97.7 F (36.5 C)   TempSrc: Temporal    SpO2: 100%  99%  Weight: 208 lb (94.3 kg)    Height: 6' (1.829 m)     Body mass index is 28.21 kg/m. GEN: NAD EYE: Sclerae anicteric ENT: MMM CV: Non-tachycardic GI: Soft, NT/ND NEURO:  Alert & Oriented x 3  Lab Results: No results for input(s): WBC, HGB, HCT, PLT in the last  72 hours. BMET No results for input(s): NA, K, CL, CO2, GLUCOSE, BUN, CREATININE, CALCIUM in the last 72 hours. LFT No results for input(s): PROT, ALBUMIN, AST, ALT, ALKPHOS, BILITOT, BILIDIR, IBILI in the last 72 hours. PT/INR No results for input(s): LABPROT, INR in the last 72 hours.   Impression / Plan: This is a 50 y.o.male who presents for Colonoscopy for screening.  The risks and benefits of endoscopic evaluation/treatment were discussed with the patient and/or family; these include but are not limited to the risk of perforation, infection, bleeding, missed lesions, lack of diagnosis, severe illness requiring hospitalization, as well as anesthesia and sedation related illnesses.  The patient's history has been reviewed, patient examined, no change in status, and deemed stable for procedure.  The patient and/or family is agreeable to proceed.    Justice Britain, MD [facility] Advanced Endoscopy Office # [phone]

## 2020-11-20 NOTE — Progress Notes (Signed)
Report given to PACU, vss 

## 2020-11-20 NOTE — Patient Instructions (Signed)
Handout given:  Hemorrhoids, polyps Start high fiber diet Use fibercon 1-2 tablets daily Continue current medications Await pathology results Repeat colonoscopy in 3 years  YOU HAD AN ENDOSCOPIC PROCEDURE TODAY AT [facility]:   Refer to the procedure report that was given to you for any specific questions about what was found during the examination.  If the procedure report does not answer your questions, please call your gastroenterologist to clarify.  If you requested that your care partner not be given the details of your procedure findings, then the procedure report has been included in a sealed envelope for you to review at your convenience later.  YOU SHOULD EXPECT: Some feelings of bloating in the abdomen. Passage of more gas than usual.  Walking can help get rid of the air that was put into your GI tract during the procedure and reduce the bloating. If you had a lower endoscopy (such as a colonoscopy or flexible sigmoidoscopy) you may notice spotting of blood in your stool or on the toilet paper. If you underwent a bowel prep for your procedure, you may not have a normal bowel movement for a few days.  Please Note:  You might notice some irritation and congestion in your nose or some drainage.  This is from the oxygen used during your procedure.  There is no need for concern and it should clear up in a day or so.  SYMPTOMS TO REPORT IMMEDIATELY:  Following lower endoscopy (colonoscopy or flexible sigmoidoscopy):  Excessive amounts of blood in the stool  Significant tenderness or worsening of abdominal pains  Swelling of the abdomen that is new, acute  Fever of 100F or higher  For urgent or emergent issues, a gastroenterologist can be reached at any hour by calling [phone]. Do not use MyChart messaging for urgent concerns.   DIET:  We do recommend a small meal at first, but then you may proceed to your regular diet.  Drink plenty of fluids but you should avoid  alcoholic beverages for 24 hours.  ACTIVITY:  You should plan to take it easy for the rest of today and you should NOT DRIVE or use heavy machinery until tomorrow (because of the sedation medicines used during the test).    FOLLOW UP: Our staff will call the number listed on your records 48-72 hours following your procedure to check on you and address any questions or concerns that you may have regarding the information given to you following your procedure. If we do not reach you, we will leave a message.  We will attempt to reach you two times.  During this call, we will ask if you have developed any symptoms of COVID 19. If you develop any symptoms (ie: fever, flu-like symptoms, shortness of breath, cough etc.) before then, please call [phone].  If you test positive for Covid 19 in the 2 weeks post procedure, please call and report this information to [place].    If any biopsies were taken you will be contacted by phone or by letter within the next 1-3 weeks.  Please call us at [phone] if you have not heard about the biopsies in 3 weeks.   SIGNATURES/CONFIDENTIALITY: You and/or your care partner have signed paperwork which will be entered into your electronic medical record.  These signatures attest to the fact that that the information above on your After Visit Summary has been reviewed and is understood.  Full responsibility of the confidentiality of this discharge information lies with  you and/or your care-partner.

## 2020-11-20 NOTE — Progress Notes (Signed)
Called to room to assist during endoscopic procedure.  Patient ID and intended procedure confirmed with present staff. Received instructions for my participation in the procedure from the performing physician.  

## 2020-11-20 NOTE — Progress Notes (Signed)
VS- Connor Tate  Pt's states no medical or surgical changes since previsit or office visit.  

## 2020-11-20 NOTE — Op Note (Signed)
[place] Patient Name: Connor Tate Procedure Date: 11/20/2020 9:42 AM MRN: [id number] Endoscopist: Justice Britain , MD Age: 50 Referring MD:  Date of Birth: Feb 08, 1971 Gender: Male Account #: [id number] Procedure:                Colonoscopy Indications:              Screening for colorectal malignant neoplasm, This                            is the patient's first colonoscopy Medicines:                Monitored Anesthesia Care Procedure:                Pre-Anesthesia Assessment:                           - Prior to the procedure, a History and Physical                            was performed, and patient medications and                            allergies were reviewed. The patient's tolerance of                            previous anesthesia was also reviewed. The risks                            and benefits of the procedure and the sedation                            options and risks were discussed with the patient.                            All questions were answered, and informed consent                            was obtained. Prior Anticoagulants: The patient has                            taken no previous anticoagulant or antiplatelet                            agents. ASA Grade Assessment: II - A patient with                            mild systemic disease. After reviewing the risks                            and benefits, the patient was deemed in                            satisfactory condition to undergo the procedure.  After obtaining informed consent, the colonoscope                            was passed under direct vision. Throughout the                            procedure, the patient's blood pressure, pulse, and                            oxygen saturations were monitored continuously. The                            Olympus CF-HQ190L ([id number]) Colonoscope was                            introduced through the anus  and advanced to the 5                            cm into the ileum. The colonoscopy was performed                            without difficulty. The patient tolerated the                            procedure. The quality of the bowel preparation was                            good. The terminal ileum, ileocecal valve,                            appendiceal orifice, and rectum were photographed. Scope In: 10:03:09 AM Scope Out: 10:20:12 AM Scope Withdrawal Time: 0 hours 14 minutes 14 seconds  Total Procedure Duration: 0 hours 17 minutes 3 seconds  Findings:                 The digital rectal exam was normal. Pertinent                            negatives include no palpable rectal lesions.                           The terminal ileum and ileocecal valve appeared                            normal.                           Four sessile polyps were found in the rectum,                            recto-sigmoid colon, descending colon and                            transverse colon. The polyps were 2 to 10 mm in  size. These polyps were removed with a cold snare.                            Resection and retrieval were complete.                           Normal mucosa was found in the entire colon                            otherwise.                           Non-bleeding non-thrombosed internal hemorrhoids                            were found during retroflexion, during perianal                            exam and during digital exam. The hemorrhoids were                            Grade I (internal hemorrhoids that do not prolapse). Complications:            No immediate complications. Estimated Blood Loss:     Estimated blood loss was minimal. Impression:               - The examined portion of the ileum was normal.                           - Four 2 to 10 mm polyps in the rectum, at the                            recto-sigmoid colon, in the descending colon and  in                            the transverse colon, removed with a cold snare.                            Resected and retrieved.                           - Normal mucosa in the entire examined colon                            otherwise.                           - Non-bleeding non-thrombosed internal hemorrhoids. Recommendation:           - The patient will be observed post-procedure,                            until all discharge criteria are met.                           - Discharge patient to  home.                           - Patient has a contact number available for                            emergencies. The signs and symptoms of potential                            delayed complications were discussed with the                            patient. Return to normal activities tomorrow.                            Written discharge instructions were provided to the                            patient.                           - High fiber diet.                           - Use FiberCon 1-2 tablets PO daily.                           - Continue present medications.                           - Await pathology results.                           - Repeat colonoscopy in 3 years for surveillance.                           - The findings and recommendations were discussed                            with the patient.                           - The findings and recommendations were discussed                            with the patient's family. Justice Britain, MD 11/20/2020 10:25:25 AM

## 2020-11-22 ENCOUNTER — Encounter: Payer: Self-pay | Admitting: Gastroenterology

## 2020-11-22 ENCOUNTER — Telehealth: Payer: Self-pay

## 2020-11-22 NOTE — Telephone Encounter (Signed)
Left message on follow up call. 

## 2020-12-05 ENCOUNTER — Ambulatory Visit: Payer: PRIVATE HEALTH INSURANCE | Admitting: Emergency Medicine

## 2021-01-31 ENCOUNTER — Other Ambulatory Visit: Payer: Self-pay | Admitting: Emergency Medicine

## 2021-01-31 DIAGNOSIS — E119 Type 2 diabetes mellitus without complications: Secondary | ICD-10-CM

## 2021-02-25 ENCOUNTER — Other Ambulatory Visit: Payer: Self-pay | Admitting: Emergency Medicine

## 2021-02-25 DIAGNOSIS — E785 Hyperlipidemia, unspecified: Secondary | ICD-10-CM

## 2021-06-12 ENCOUNTER — Encounter: Payer: No Typology Code available for payment source | Admitting: Emergency Medicine

## 2021-06-14 ENCOUNTER — Telehealth: Payer: Self-pay | Admitting: Emergency Medicine

## 2021-06-14 NOTE — Telephone Encounter (Signed)
Pt requesting an order for labs prior to 06-24-2021 cpe appt ?

## 2021-06-14 NOTE — Telephone Encounter (Signed)
Called and scheduled a lab appt for the pt on 4/20 at 8:40am. ?

## 2021-06-19 ENCOUNTER — Encounter: Payer: No Typology Code available for payment source | Admitting: Emergency Medicine

## 2021-06-20 ENCOUNTER — Other Ambulatory Visit: Payer: Self-pay | Admitting: Emergency Medicine

## 2021-06-20 ENCOUNTER — Other Ambulatory Visit: Payer: Self-pay

## 2021-06-20 DIAGNOSIS — E119 Type 2 diabetes mellitus without complications: Secondary | ICD-10-CM

## 2021-06-20 DIAGNOSIS — Z0000 Encounter for general adult medical examination without abnormal findings: Secondary | ICD-10-CM

## 2021-06-20 LAB — COMPREHENSIVE METABOLIC PANEL
ALT: 27 U/L (ref 0–53)
AST: 18 U/L (ref 0–37)
Albumin: 4.3 g/dL (ref 3.5–5.2)
Alkaline Phosphatase: 96 U/L (ref 39–117)
BUN: 13 mg/dL (ref 6–23)
CO2: 26 mEq/L (ref 19–32)
Calcium: 9.1 mg/dL (ref 8.4–10.5)
Chloride: 103 mEq/L (ref 96–112)
Creatinine, Ser: 1.04 mg/dL (ref 0.40–1.50)
GFR: 84.91 mL/min (ref >60.00)
Glucose, Bld: 182 mg/dL — ABNORMAL HIGH (ref 70–99)
Potassium: 4.3 mEq/L (ref 3.5–5.1)
Sodium: 138 mEq/L (ref 135–145)
Total Bilirubin: 0.6 mg/dL (ref 0.2–1.2)
Total Protein: 7.1 g/dL (ref 6.0–8.3)

## 2021-06-20 LAB — URINALYSIS, ROUTINE W REFLEX MICROSCOPIC
Bilirubin Urine: NEGATIVE
Hgb urine dipstick: NEGATIVE
Ketones, ur: NEGATIVE
Leukocytes,Ua: NEGATIVE
Nitrite: NEGATIVE
RBC / HPF: NONE SEEN (ref 0–?)
Specific Gravity, Urine: 1.02 (ref 1.000–1.030)
Total Protein, Urine: NEGATIVE
Urine Glucose: NEGATIVE
Urobilinogen, UA: 1 (ref 0.0–1.0)
pH: 6.5 (ref 5.0–8.0)

## 2021-06-20 LAB — HEPATIC FUNCTION PANEL
ALT: 27 U/L (ref 0–53)
AST: 19 U/L (ref 0–37)
Albumin: 4.4 g/dL (ref 3.5–5.2)
Alkaline Phosphatase: 95 U/L (ref 39–117)
Bilirubin, Direct: 0.1 mg/dL (ref 0.0–0.3)
Total Bilirubin: 0.7 mg/dL (ref 0.2–1.2)
Total Protein: 7 g/dL (ref 6.0–8.3)

## 2021-06-20 LAB — CBC WITH DIFFERENTIAL/PLATELET
Basophils Absolute: 0.1 10*3/uL (ref 0.0–0.1)
Basophils Relative: 1.3 % (ref 0.0–3.0)
Eosinophils Absolute: 0.2 10*3/uL (ref 0.0–0.7)
Eosinophils Relative: 3.2 % (ref 0.0–5.0)
HCT: 48.8 % (ref 39.0–52.0)
Hemoglobin: 16.2 g/dL (ref 13.0–17.0)
Lymphocytes Relative: 35.5 % (ref 12.0–46.0)
Lymphs Abs: 2.2 10*3/uL (ref 0.7–4.0)
MCHC: 33.3 g/dL (ref 30.0–36.0)
MCV: 86 fl (ref 78.0–100.0)
Monocytes Absolute: 0.5 10*3/uL (ref 0.1–1.0)
Monocytes Relative: 7.8 % (ref 3.0–12.0)
Neutro Abs: 3.2 10*3/uL (ref 1.4–7.7)
Neutrophils Relative %: 52.2 % (ref 43.0–77.0)
Platelets: 256 10*3/uL (ref 150.0–400.0)
RBC: 5.67 Mil/uL (ref 4.22–5.81)
RDW: 13.1 % (ref 11.5–15.5)
WBC: 6.1 10*3/uL (ref 4.0–10.5)

## 2021-06-20 LAB — LIPID PANEL
Cholesterol: 209 mg/dL — ABNORMAL HIGH (ref 0–200)
HDL: 42.4 mg/dL (ref >39.00)
LDL Cholesterol: 139 mg/dL — ABNORMAL HIGH (ref 0–99)
NonHDL: 166.25
Total CHOL/HDL Ratio: 5
Triglycerides: 136 mg/dL (ref 0.0–149.0)
VLDL: 27.2 mg/dL (ref 0.0–40.0)

## 2021-06-20 LAB — HEMOGLOBIN A1C: Hgb A1c MFr Bld: 8.8 % — ABNORMAL HIGH (ref 4.6–6.5)

## 2021-06-24 ENCOUNTER — Encounter: Payer: Self-pay | Admitting: Emergency Medicine

## 2021-06-24 ENCOUNTER — Ambulatory Visit (INDEPENDENT_AMBULATORY_CARE_PROVIDER_SITE_OTHER): Payer: No Typology Code available for payment source | Admitting: Emergency Medicine

## 2021-06-24 VITALS — BP 120/70 | HR 88 | Temp 98.0°F | Ht 72.0 in | Wt 218.0 lb

## 2021-06-24 DIAGNOSIS — E1169 Type 2 diabetes mellitus with other specified complication: Secondary | ICD-10-CM

## 2021-06-24 DIAGNOSIS — E1165 Type 2 diabetes mellitus with hyperglycemia: Secondary | ICD-10-CM

## 2021-06-24 DIAGNOSIS — Z0001 Encounter for general adult medical examination with abnormal findings: Secondary | ICD-10-CM

## 2021-06-24 DIAGNOSIS — E785 Hyperlipidemia, unspecified: Secondary | ICD-10-CM | POA: Diagnosis not present

## 2021-06-24 MED ORDER — METFORMIN HCL 500 MG PO TABS: 500.0000 mg | ORAL_TABLET | Freq: Two times a day (BID) | ORAL | 3 refills | Status: DC

## 2021-06-24 MED ORDER — EMPAGLIFLOZIN 10 MG PO TABS: 10.0000 mg | ORAL_TABLET | Freq: Every day | ORAL | 3 refills | Status: DC

## 2021-06-24 MED ORDER — ROSUVASTATIN CALCIUM 20 MG PO TABS: 20.0000 mg | ORAL_TABLET | Freq: Every day | ORAL | 3 refills | Status: DC

## 2021-06-24 NOTE — Progress Notes (Signed)
Connor Tate ?51 y.o. ? ? ?Chief Complaint  ?Patient presents with  ? Annual Exam  ?  No concerns  ? ? ?HISTORY OF PRESENT ILLNESS: ?This is a 51 y.o. male here for annual exam. A1A ?Recent blood work shows increased glucose and hemoglobin A1c. ?Abnormal lipid profile with elevated cholesterol and LDL. ?Patient has history of dyslipidemia and diabetes ?Presently only taking metformin 500 mg once a day. ?No other complaints or medical concerns today. ? ?HPI ? ? ?Prior to Admission medications   ?Medication Sig Start Date End Date Taking? Authorizing Provider  ?Cetirizine HCl (ZYRTEC PO) Take by mouth daily.   Yes [provider]  ?empagliflozin (JARDIANCE) 10 MG TABS tablet Take 1 tablet (10 mg total) by mouth daily before breakfast. 06/24/21 06/19/22 Yes Connor Tate, Connor Bloomer, MD  ?Omeprazole Magnesium (PRILOSEC OTC PO) Take by mouth daily.   Yes [provider]  ?metFORMIN (GLUCOPHAGE) 500 MG tablet Take 1 tablet (500 mg total) by mouth 2 (two) times daily with a meal. 06/24/21   Connor Tate, Connor Bloomer, MD  ?rosuvastatin (CRESTOR) 20 MG tablet Take 1 tablet (20 mg total) by mouth daily. 06/24/21 06/19/22  Connor Pollen, MD  ? ? ?Allergies  ?Allergen Reactions  ? Sulfa Antibiotics Rash  ?  Itchy and burning palms  ? Sulfasalazine Rash  ?  Itchy and burning palms  ? ? ?Patient Active Problem List  ? Diagnosis Date Noted  ? Dyslipidemia 08/25/2017  ? Dyslipidemia associated with type 2 diabetes mellitus ([place]) 08/28/2015  ? Essential hypertension 08/28/2015  ? Wolff-Parkinson-White (WPW) syndrome, type A 08/28/2015  ? Insomnia with sleep apnea 08/28/2015  ? OSA on CPAP 08/03/2014  ? ? ?Past Medical History:  ?Diagnosis Date  ? Atypical chest pain   ? Diabetes mellitus without complication ([place])   ? pre-DM- on meds for prevention  ? GERD (gastroesophageal reflux disease)   ? on meds  ? Hyperlipidemia   ? - with diet change  ? L4-L5 disc bulge   ? Right bundle branch block   ? Seasonal allergies    ? Sleep apnea   ? does not use CPAP  ? Snoring   ? Wolff-Parkinson-White (WPW) syndrome   ? ? ?Past Surgical History:  ?Procedure Laterality Date  ? SHOULDER ARTHROSCOPY WITH ROTATOR CUFF REPAIR AND OPEN BICEPS TENODESIS Bilateral 2012  ? and labrum repair  ? WISDOM TOOTH EXTRACTION    ? ? ?Social History  ? ?Socioeconomic History  ? Marital status: Married  ?  Spouse name: Not on file  ? Number of children: Not on file  ? Years of education: Not on file  ? Highest education level: Not on file  ?Occupational History  ? Not on file  ?Tobacco Use  ? Smoking status: Never  ? Smokeless tobacco: Never  ?Vaping Use  ? Vaping Use: Never used  ?Substance and Sexual Activity  ? Alcohol use: Yes  ?  Alcohol/week: 12.0 standard drinks  ?  Types: 12 Standard drinks or equivalent per week  ? Drug use: No  ? Sexual activity: Yes  ?  Partners: Female  ?Other Topics Concern  ? Not on file  ?Social History Narrative  ? Not on file  ? ?Social Determinants of Health  ? ?Financial Resource Strain: Not on file  ?Food Insecurity: Not on file  ?Transportation Needs: Not on file  ?Physical Activity: Not on file  ?Stress: Not on file  ?Social Connections: Not on file  ?Intimate Partner Violence: Not  on file  ? ? ?Family History  ?Problem Relation Age of Onset  ? Heart disease Mother   ? Alcohol abuse Neg Hx   ? Cancer Neg Hx   ? COPD Neg Hx   ? Depression Neg Hx   ? Diabetes Neg Hx   ? Drug abuse Neg Hx   ? Early death Neg Hx   ? Hearing loss Neg Hx   ? Hyperlipidemia Neg Hx   ? Hypertension Neg Hx   ? Kidney disease Neg Hx   ? Stroke Neg Hx   ? Colon polyps Neg Hx   ? Colon cancer Neg Hx   ? Rectal cancer Neg Hx   ? Stomach cancer Neg Hx   ? ? ? ?Review of Systems  ?Constitutional: Negative.  Negative for chills and fever.  ?HENT: Negative.  Negative for congestion and sore throat.   ?Respiratory: Negative.  Negative for cough and shortness of breath.   ?Cardiovascular: Negative.  Negative for chest pain and palpitations.   ?Gastrointestinal: Negative.  Negative for abdominal pain, diarrhea, nausea and vomiting.  ?Genitourinary: Negative.  Negative for dysuria and hematuria.  ?Musculoskeletal: Negative.   ?Skin: Negative.  Negative for rash.  ?Neurological:  Negative for dizziness and headaches.  ?All other systems reviewed and are negative. ?Today's Vitals  ? 06/24/21 1325 06/24/21 1328  ?BP: (!) 132/94 134/90  ?Pulse: 88   ?Temp: 98 ?F (36.7 ?C)   ?TempSrc: Oral   ?SpO2: 96%   ?Weight: 218 lb (98.9 kg)   ?Height: 6' (1.829 m)   ? ?Body mass index is 29.57 kg/m?. ? ? ?Physical Exam ?Vitals reviewed.  ?Constitutional:   ?   Appearance: Normal appearance.  ?HENT:  ?   Head: Normocephalic.  ?   Right Ear: Tympanic membrane, ear canal and external ear normal.  ?   Left Ear: Tympanic membrane, ear canal and external ear normal.  ?   Mouth/Throat:  ?   Mouth: Mucous membranes are moist.  ?   Pharynx: Oropharynx is clear.  ?Eyes:  ?   Extraocular Movements: Extraocular movements intact.  ?   Conjunctiva/sclera: Conjunctivae normal.  ?   Pupils: Pupils are equal, round, and reactive to light.  ?Cardiovascular:  ?   Rate and Rhythm: Normal rate and regular rhythm.  ?   Pulses: Normal pulses.  ?   Heart sounds: Normal heart sounds.  ?Pulmonary:  ?   Effort: Pulmonary effort is normal.  ?   Breath sounds: Normal breath sounds.  ?Abdominal:  ?   General: There is no distension.  ?   Palpations: Abdomen is soft.  ?   Tenderness: There is no abdominal tenderness.  ?Musculoskeletal:  ?   Cervical back: No tenderness.  ?   Right lower leg: No edema.  ?   Left lower leg: No edema.  ?Lymphadenopathy:  ?   Cervical: No cervical adenopathy.  ?Skin: ?   General: Skin is warm and dry.  ?   Capillary Refill: Capillary refill takes less than 2 seconds.  ?Neurological:  ?   General: No focal deficit present.  ?   Mental Status: He is alert and oriented to person, place, and time.  ?Psychiatric:     ?   Mood and Affect: Mood normal.     ?   Behavior: Behavior  normal.  ? ? ? ?ASSESSMENT & PLAN: ?Problem List Items Addressed This Visit   ? ?  ? Endocrine  ? Dyslipidemia associated with type 2  diabetes mellitus ([place])  ?  Abnormal lipid profile with elevated cholesterol and LDL. ?We will start rosuvastatin 20 mg daily. ?Diet and nutrition discussed. ?The 10-year ASCVD risk score (Arnett DK, et al., 2019) is: 6.2% ?  Values used to calculate the score: ?    Age: 18 years ?    Sex: Male ?    Is Non-Hispanic African American: No ?    Diabetic: Yes ?    Tobacco smoker: No ?    Systolic Blood Pressure: 354 mmHg ?    Is BP treated: No ?    HDL Cholesterol: 42.4 mg/dL ?    Total Cholesterol: 209 mg/dL ? ? ?  ?  ? Relevant Medications  ? rosuvastatin (CRESTOR) 20 MG tablet  ? metFORMIN (GLUCOPHAGE) 500 MG tablet  ? empagliflozin (JARDIANCE) 10 MG TABS tablet  ? Uncontrolled type 2 diabetes mellitus with hyperglycemia ([place])  ?  Uncontrolled diabetes with hemoglobin A1c at 8.8 ?Lab Results  ?Component Value Date  ? HGBA1C 8.8 (H) 06/20/2021  ?Increase metformin to 500 mg twice a day and start Jardiance 10 mg daily. ?Diet and nutrition discussed. ?Cardiovascular risk associated with uncontrolled diabetes discussed. ?Follow-up in 3 months. ?  ?  ? Relevant Medications  ? rosuvastatin (CRESTOR) 20 MG tablet  ? metFORMIN (GLUCOPHAGE) 500 MG tablet  ? empagliflozin (JARDIANCE) 10 MG TABS tablet  ? ?Other Visit Diagnoses   ? ? Encounter for general adult medical examination with abnormal findings    -  Primary  ? ?  ? ?Modifiable risk factors discussed with patient. ?Anticipatory guidance according to age provided. ?The following topics were also discussed: ?Social Determinants of Health ?Smoking.  Non-smoker ?Diet and nutrition and need to decrease amount of daily carbohydrate intake ?Benefits of exercise ?Cancer screening and review of most recent colonoscopy report ?Vaccinations recommendations ?Cardiovascular risk assessment ?Management of uncontrolled diabetes and new  medications ?Mental health including depression and anxiety ?Fall and accident prevention ? ?Patient Instructions  ?Diabetes Mellitus and Nutrition, Adult ?When you have diabetes, or diabetes mellitus, it is very important to have he

## 2021-06-24 NOTE — Assessment & Plan Note (Signed)
Abnormal lipid profile with elevated cholesterol and LDL. ?We will start rosuvastatin 20 mg daily. ?Diet and nutrition discussed. ?The 10-year ASCVD risk score (Arnett DK, et al., 2019) is: 6.2% ?  Values used to calculate the score: ?    Age: 51 years ?    Sex: Male ?    Is Non-Hispanic African American: No ?    Diabetic: Yes ?    Tobacco smoker: No ?    Systolic Blood Pressure: 184 mmHg ?    Is BP treated: No ?    HDL Cholesterol: 42.4 mg/dL ?    Total Cholesterol: 209 mg/dL ? ?

## 2021-06-24 NOTE — Assessment & Plan Note (Signed)
Uncontrolled diabetes with hemoglobin A1c at 8.8 ?Lab Results  ?Component Value Date  ? HGBA1C 8.8 (H) 06/20/2021  ?Increase metformin to 500 mg twice a day and start Jardiance 10 mg daily. ?Diet and nutrition discussed. ?Cardiovascular risk associated with uncontrolled diabetes discussed. ?Follow-up in 3 months. ?

## 2021-06-24 NOTE — Patient Instructions (Signed)

## 2021-09-23 ENCOUNTER — Ambulatory Visit: Payer: No Typology Code available for payment source | Admitting: Emergency Medicine

## 2022-06-16 ENCOUNTER — Ambulatory Visit (INDEPENDENT_AMBULATORY_CARE_PROVIDER_SITE_OTHER): Payer: PRIVATE HEALTH INSURANCE | Admitting: Emergency Medicine

## 2022-06-16 ENCOUNTER — Encounter: Payer: Self-pay | Admitting: Emergency Medicine

## 2022-06-16 VITALS — BP 138/88 | HR 54 | Temp 98.2°F | Ht 72.0 in | Wt 210.2 lb

## 2022-06-16 DIAGNOSIS — Z0001 Encounter for general adult medical examination with abnormal findings: Secondary | ICD-10-CM

## 2022-06-16 DIAGNOSIS — Z130 Encounter for screening for diseases of the blood and blood-forming organs and certain disorders involving the immune mechanism: Secondary | ICD-10-CM

## 2022-06-16 DIAGNOSIS — Z13228 Encounter for screening for other metabolic disorders: Secondary | ICD-10-CM

## 2022-06-16 DIAGNOSIS — E1169 Type 2 diabetes mellitus with other specified complication: Secondary | ICD-10-CM

## 2022-06-16 DIAGNOSIS — E785 Hyperlipidemia, unspecified: Secondary | ICD-10-CM

## 2022-06-16 DIAGNOSIS — Z1159 Encounter for screening for other viral diseases: Secondary | ICD-10-CM | POA: Diagnosis not present

## 2022-06-16 DIAGNOSIS — Z1329 Encounter for screening for other suspected endocrine disorder: Secondary | ICD-10-CM

## 2022-06-16 DIAGNOSIS — I10 Essential (primary) hypertension: Secondary | ICD-10-CM

## 2022-06-16 DIAGNOSIS — Z13220 Encounter for screening for lipoid disorders: Secondary | ICD-10-CM | POA: Diagnosis not present

## 2022-06-16 LAB — COMPREHENSIVE METABOLIC PANEL
ALT: 20 U/L (ref 0–53)
AST: 16 U/L (ref 0–37)
Albumin: 4.5 g/dL (ref 3.5–5.2)
Alkaline Phosphatase: 103 U/L (ref 39–117)
BUN: 12 mg/dL (ref 6–23)
CO2: 26 mEq/L (ref 19–32)
Calcium: 9.2 mg/dL (ref 8.4–10.5)
Chloride: 105 mEq/L (ref 96–112)
Creatinine, Ser: 0.92 mg/dL (ref 0.40–1.50)
GFR: 97.68 mL/min (ref >60.00)
Glucose, Bld: 107 mg/dL — ABNORMAL HIGH (ref 70–99)
Potassium: 4.2 mEq/L (ref 3.5–5.1)
Sodium: 138 mEq/L (ref 135–145)
Total Bilirubin: 0.5 mg/dL (ref 0.2–1.2)
Total Protein: 7 g/dL (ref 6.0–8.3)

## 2022-06-16 LAB — LIPID PANEL
Cholesterol: 121 mg/dL (ref 0–200)
HDL: 40.9 mg/dL (ref >39.00)
LDL Cholesterol: 59 mg/dL (ref 0–99)
NonHDL: 80.51
Total CHOL/HDL Ratio: 3
Triglycerides: 108 mg/dL (ref 0.0–149.0)
VLDL: 21.6 mg/dL (ref 0.0–40.0)

## 2022-06-16 LAB — CBC WITH DIFFERENTIAL/PLATELET
Basophils Absolute: 0.1 10*3/uL (ref 0.0–0.1)
Basophils Relative: 1.1 % (ref 0.0–3.0)
Eosinophils Absolute: 0.2 10*3/uL (ref 0.0–0.7)
Eosinophils Relative: 3.5 % (ref 0.0–5.0)
HCT: 48.7 % (ref 39.0–52.0)
Hemoglobin: 16.4 g/dL (ref 13.0–17.0)
Lymphocytes Relative: 29.6 % (ref 12.0–46.0)
Lymphs Abs: 1.9 10*3/uL (ref 0.7–4.0)
MCHC: 33.6 g/dL (ref 30.0–36.0)
MCV: 85.1 fl (ref 78.0–100.0)
Monocytes Absolute: 0.5 10*3/uL (ref 0.1–1.0)
Monocytes Relative: 7.4 % (ref 3.0–12.0)
Neutro Abs: 3.8 10*3/uL (ref 1.4–7.7)
Neutrophils Relative %: 58.4 % (ref 43.0–77.0)
Platelets: 253 10*3/uL (ref 150.0–400.0)
RBC: 5.73 Mil/uL (ref 4.22–5.81)
RDW: 13.5 % (ref 11.5–15.5)
WBC: 6.6 10*3/uL (ref 4.0–10.5)

## 2022-06-16 LAB — MICROALBUMIN / CREATININE URINE RATIO
Creatinine,U: 52.5 mg/dL
Microalb Creat Ratio: 1.3 mg/g (ref 0.0–30.0)
Microalb, Ur: <0.7 mg/dL (ref 0.0–1.9)

## 2022-06-16 LAB — TESTOSTERONE: Testosterone: 297.84 ng/dL — ABNORMAL LOW (ref 300.00–890.00)

## 2022-06-16 LAB — POCT GLYCOSYLATED HEMOGLOBIN (HGB A1C): Hemoglobin A1C: 6.6 % — AB (ref 4.0–5.6)

## 2022-06-16 MED ORDER — EMPAGLIFLOZIN 10 MG PO TABS: 10.0000 mg | ORAL_TABLET | Freq: Every day | ORAL | 3 refills | Status: DC

## 2022-06-16 MED ORDER — ROSUVASTATIN CALCIUM 20 MG PO TABS: 20.0000 mg | ORAL_TABLET | Freq: Every day | ORAL | 3 refills | Status: AC

## 2022-06-16 MED ORDER — METFORMIN HCL 500 MG PO TABS: 500.0000 mg | ORAL_TABLET | Freq: Two times a day (BID) | ORAL | 3 refills | Status: DC

## 2022-06-16 NOTE — Patient Instructions (Signed)
Health Maintenance, Male Adopting a healthy lifestyle and getting preventive care are important in promoting health and wellness. Ask your health care provider about: The right schedule for you to have regular tests and exams. Things you can do on your own to prevent diseases and keep yourself healthy. What should I know about diet, weight, and exercise? Eat a healthy diet  Eat a diet that includes plenty of vegetables, fruits, low-fat dairy products, and lean protein. Do not eat a lot of foods that are high in solid fats, added sugars, or sodium. Maintain a healthy weight Body mass index (BMI) is a measurement that can be used to identify possible weight problems. It estimates body fat based on height and weight. Your health care provider can help determine your BMI and help you achieve or maintain a healthy weight. Get regular exercise Get regular exercise. This is one of the most important things you can do for your health. Most adults should: Exercise for at least 150 minutes each week. The exercise should increase your heart rate and make you sweat (moderate-intensity exercise). Do strengthening exercises at least twice a week. This is in addition to the moderate-intensity exercise. Spend less time sitting. Even light physical activity can be beneficial. Watch cholesterol and blood lipids Have your blood tested for lipids and cholesterol at 50 years of age, then have this test every 5 years. You may need to have your cholesterol levels checked more often if: Your lipid or cholesterol levels are high. You are older than 50 years of age. You are at high risk for heart disease. What should I know about cancer screening? Many types of cancers can be detected early and may often be prevented. Depending on your health history and family history, you may need to have cancer screening at various ages. This may include screening for: Colorectal cancer. Prostate cancer. Skin cancer. Lung  cancer. What should I know about heart disease, diabetes, and high blood pressure? Blood pressure and heart disease High blood pressure causes heart disease and increases the risk of stroke. This is more likely to develop in people who have high blood pressure readings or are overweight. Talk with your health care provider about your target blood pressure readings. Have your blood pressure checked: Every 3-5 years if you are 18-39 years of age. Every year if you are 40 years old or older. If you are between the ages of 65 and 75 and are a current or former smoker, ask your health care provider if you should have a one-time screening for abdominal aortic aneurysm (AAA). Diabetes Have regular diabetes screenings. This checks your fasting blood sugar level. Have the screening done: Once every three years after age 45 if you are at a normal weight and have a low risk for diabetes. More often and at a younger age if you are overweight or have a high risk for diabetes. What should I know about preventing infection? Hepatitis B If you have a higher risk for hepatitis B, you should be screened for this virus. Talk with your health care provider to find out if you are at risk for hepatitis B infection. Hepatitis C Blood testing is recommended for: Everyone born from 1945 through 1965. Anyone with known risk factors for hepatitis C. Sexually transmitted infections (STIs) You should be screened each year for STIs, including gonorrhea and chlamydia, if: You are sexually active and are younger than 50 years of age. You are older than 50 years of age and your   health care provider tells you that you are at risk for this type of infection. Your sexual activity has changed since you were last screened, and you are at increased risk for chlamydia or gonorrhea. Ask your health care provider if you are at risk. Ask your health care provider about whether you are at high risk for HIV. Your health care provider  may recommend a prescription medicine to help prevent HIV infection. If you choose to take medicine to prevent HIV, you should first get tested for HIV. You should then be tested every 3 months for as long as you are taking the medicine. Follow these instructions at home: Alcohol use Do not drink alcohol if your health care provider tells you not to drink. If you drink alcohol: Limit how much you have to 0-2 drinks a day. Know how much alcohol is in your drink. In the [place] one drink equals one 12 oz bottle of beer (355 mL), one 5 oz glass of wine (148 mL), or one 1 oz glass of hard liquor (44 mL). Lifestyle Do not use any products that contain nicotine or tobacco. These products include cigarettes, chewing tobacco, and vaping devices, such as e-cigarettes. If you need help quitting, ask your health care provider. Do not use street drugs. Do not share needles. Ask your health care provider for help if you need support or information about quitting drugs. General instructions Schedule regular health, dental, and eye exams. Stay current with your vaccines. Tell your health care provider if: You often feel depressed. You have ever been abused or do not feel safe at home. Summary Adopting a healthy lifestyle and getting preventive care are important in promoting health and wellness. Follow your health care provider's instructions about healthy diet, exercising, and getting tested or screened for diseases. Follow your health care provider's instructions on monitoring your cholesterol and blood pressure. This information is not intended to replace advice given to you by your health care provider. Make sure you discuss any questions you have with your health care provider. Document Revised: 07/09/2020 Document Reviewed: 07/09/2020 Elsevier Patient Education  2023 [employer]  

## 2022-06-16 NOTE — Assessment & Plan Note (Signed)
Well-controlled diabetes with hemoglobin A1c of 6.6 Continue metformin 500 mg twice a day along with Jardiance 10 mg daily Continue rosuvastatin 20 mg daily Cardiovascular risk associated with dyslipidemia and diabetes discussed Diet and nutrition discussed Follow-up in 6 months

## 2022-06-16 NOTE — Assessment & Plan Note (Signed)
Well-controlled hypertension off medications. BP Readings from Last 3 Encounters:  06/16/22 138/88  06/24/21 120/70  11/20/20 133/79  Cardiovascular risks associated with hypertension discussed. Dietary approaches to stop hypertension discussed

## 2022-06-16 NOTE — Progress Notes (Signed)
Connor Tate 52 y.o.   Chief Complaint  Patient presents with   Annual Exam    Lack of energy , patient wants to get his testosterone levels check     HISTORY OF PRESENT ILLNESS: This is a 52 y.o. male A1A here for annual exam and follow-up of diabetes and dyslipidemia as well Requesting testosterone levels checked.  Has occasional lack of energy symptoms Overall doing better.  Eating better and exercising No other complaints or medical concerns today.  HPI   Prior to Admission medications   Medication Sig Start Date End Date Taking? Authorizing Provider  Cetirizine HCl (ZYRTEC PO) Take by mouth daily.   Yes [provider]  empagliflozin (JARDIANCE) 10 MG TABS tablet Take 1 tablet (10 mg total) by mouth daily before breakfast. 06/24/21 06/19/22 Yes Augustus Zurawski, Eilleen Kempf, MD  metFORMIN (GLUCOPHAGE) 500 MG tablet Take 1 tablet (500 mg total) by mouth 2 (two) times daily with a meal. 06/24/21  Yes Kamaile Zachow, Eilleen Kempf, MD  rosuvastatin (CRESTOR) 20 MG tablet Take 1 tablet (20 mg total) by mouth daily. 06/24/21 06/19/22 Yes Mairlyn Tegtmeyer, Eilleen Kempf, MD  Omeprazole Magnesium (PRILOSEC OTC PO) Take by mouth daily.    [provider]    Allergies  Allergen Reactions   Sulfa Antibiotics Rash    Itchy and burning palms   Sulfasalazine Rash    Itchy and burning palms    Patient Active Problem List   Diagnosis Date Noted   Uncontrolled type 2 diabetes mellitus with hyperglycemia 06/24/2021   Dyslipidemia 08/25/2017   Dyslipidemia associated with type 2 diabetes mellitus 08/28/2015   Essential hypertension 08/28/2015   Wolff-Parkinson-White (WPW) syndrome, type A 08/28/2015   Insomnia with sleep apnea 08/28/2015   OSA on CPAP 08/03/2014    Past Medical History:  Diagnosis Date   Atypical chest pain    Diabetes mellitus without complication (HCC)    pre-DM- on meds for prevention   GERD (gastroesophageal reflux disease)    on meds   Hyperlipidemia    - with  diet change   L4-L5 disc bulge    Right bundle branch block    Seasonal allergies    Sleep apnea    does not use CPAP   Snoring    Wolff-Parkinson-White (WPW) syndrome     Past Surgical History:  Procedure Laterality Date   SHOULDER ARTHROSCOPY WITH ROTATOR CUFF REPAIR AND OPEN BICEPS TENODESIS Bilateral 2012   and labrum repair   WISDOM TOOTH EXTRACTION      Social History   Socioeconomic History   Marital status: Married    Spouse name: Not on file   Number of children: Not on file   Years of education: Not on file   Highest education level: Not on file  Occupational History   Not on file  Tobacco Use   Smoking status: Never   Smokeless tobacco: Never  Vaping Use   Vaping Use: Never used  Substance and Sexual Activity   Alcohol use: Yes    Alcohol/week: 12.0 standard drinks of alcohol    Types: 12 Standard drinks or equivalent per week   Drug use: No   Sexual activity: Yes    Partners: Female  Other Topics Concern   Not on file  Social History Narrative   Not on file   Social Determinants of Health   Financial Resource Strain: Not on file  Food Insecurity: Not on file  Transportation Needs: Not on file  Physical Activity: Not on file  Stress: Not on file  Social Connections: Not on file  Intimate Partner Violence: Not on file    Family History  Problem Relation Age of Onset   Heart disease Mother    Alcohol abuse Neg Hx    Cancer Neg Hx    COPD Neg Hx    Depression Neg Hx    Diabetes Neg Hx    Drug abuse Neg Hx    Early death Neg Hx    Hearing loss Neg Hx    Hyperlipidemia Neg Hx    Hypertension Neg Hx    Kidney disease Neg Hx    Stroke Neg Hx    Colon polyps Neg Hx    Colon cancer Neg Hx    Rectal cancer Neg Hx    Stomach cancer Neg Hx      Review of Systems  Constitutional: Negative.  Negative for chills, fever, malaise/fatigue and weight loss.  HENT: Negative.  Negative for congestion and sore throat.   Respiratory: Negative.   Negative for cough and shortness of breath.   Cardiovascular: Negative.  Negative for chest pain and palpitations.  Gastrointestinal:  Negative for abdominal pain, diarrhea, nausea and vomiting.  Genitourinary: Negative.  Negative for dysuria and hematuria.  Skin: Negative.  Negative for rash.  Neurological: Negative.  Negative for dizziness and headaches.  All other systems reviewed and are negative.   Vitals:   06/16/22 0957  BP: 138/88  Pulse: (!) 54  Temp: 98.2 F (36.8 C)  SpO2: 96%    Physical Exam Vitals reviewed.  Constitutional:      Appearance: Normal appearance.  HENT:     Head: Normocephalic.     Right Ear: Tympanic membrane, ear canal and external ear normal.     Left Ear: Tympanic membrane, ear canal and external ear normal.     Mouth/Throat:     Mouth: Mucous membranes are moist.     Pharynx: Oropharynx is clear.  Eyes:     Extraocular Movements: Extraocular movements intact.     Conjunctiva/sclera: Conjunctivae normal.     Pupils: Pupils are equal, round, and reactive to light.  Cardiovascular:     Rate and Rhythm: Normal rate and regular rhythm.     Pulses: Normal pulses.     Heart sounds: Normal heart sounds.  Pulmonary:     Effort: Pulmonary effort is normal.     Breath sounds: Normal breath sounds.  Abdominal:     General: There is no distension.     Palpations: Abdomen is soft.     Tenderness: There is no abdominal tenderness.  Musculoskeletal:     Cervical back: No tenderness.  Lymphadenopathy:     Cervical: No cervical adenopathy.  Skin:    General: Skin is warm and dry.  Neurological:     General: No focal deficit present.     Mental Status: He is alert and oriented to person, place, and time.  Psychiatric:        Mood and Affect: Mood normal.        Behavior: Behavior normal.    Results for orders placed or performed in visit on 06/16/22 (from the past 24 hour(s))  POCT HgB A1C     Status: Abnormal   Collection Time: 06/16/22 10:56  AM  Result Value Ref Range   Hemoglobin A1C 6.6 (A) 4.0 - 5.6 %   HbA1c POC (<> result, manual entry)     HbA1c, POC (prediabetic range)     HbA1c, POC (controlled  diabetic range)       ASSESSMENT & PLAN: Problem List Items Addressed This Visit       Cardiovascular and Mediastinum   Essential hypertension    Well-controlled hypertension off medications. BP Readings from Last 3 Encounters:  06/16/22 138/88  06/24/21 120/70  11/20/20 133/79  Cardiovascular risks associated with hypertension discussed. Dietary approaches to stop hypertension discussed       Relevant Medications   rosuvastatin (CRESTOR) 20 MG tablet     Endocrine   Dyslipidemia associated with type 2 diabetes mellitus    Well-controlled diabetes with hemoglobin A1c of 6.6 Continue metformin 500 mg twice a day along with Jardiance 10 mg daily Continue rosuvastatin 20 mg daily Cardiovascular risk associated with dyslipidemia and diabetes discussed Diet and nutrition discussed Follow-up in 6 months      Relevant Medications   rosuvastatin (CRESTOR) 20 MG tablet   empagliflozin (JARDIANCE) 10 MG TABS tablet   metFORMIN (GLUCOPHAGE) 500 MG tablet   Other Relevant Orders   Comprehensive metabolic panel   Lipid panel   Urine Microalbumin w/creat. ratio   POCT HgB A1C (Completed)   Other Visit Diagnoses     Encounter for general adult medical examination with abnormal findings    -  Primary   Relevant Orders   CBC with Differential   Comprehensive metabolic panel   Lipid panel   Hepatitis C antibody screen   Need for hepatitis C screening test       Relevant Orders   Hepatitis C antibody screen   Screening for deficiency anemia       Relevant Orders   CBC with Differential   Screening for lipoid disorders       Relevant Orders   Lipid panel   Screening for endocrine, metabolic and immunity disorder       Relevant Orders   Comprehensive metabolic panel   Hepatitis C antibody screen    Testosterone      Modifiable risk factors discussed with patient. Anticipatory guidance according to age provided. The following topics were also discussed: Social Determinants of Health Smoking.  Non-smoker Diet and nutrition.  Eating better Benefits of exercise.  Exercises regularly Cancer family history review Vaccinations reviewed and recommendations Cardiovascular risk assessment and need for blood work The 10-year ASCVD risk score (Arnett DK, et al., 2019) is: 8.6%   Values used to calculate the score:     Age: 23 years     Sex: Male     Is Non-Hispanic African American: No     Diabetic: Yes     Tobacco smoker: No     Systolic Blood Pressure: 138 mmHg     Is BP treated: No     HDL Cholesterol: 42.4 mg/dL     Total Cholesterol: 209 mg/dL Review of chronic medical conditions under management Review of all medications Mental health including depression and anxiety Fall and accident prevention  Patient Instructions  Health Maintenance, Male Adopting a healthy lifestyle and getting preventive care are important in promoting health and wellness. Ask your health care provider about: The right schedule for you to have regular tests and exams. Things you can do on your own to prevent diseases and keep yourself healthy. What should I know about diet, weight, and exercise? Eat a healthy diet  Eat a diet that includes plenty of vegetables, fruits, low-fat dairy products, and lean protein. Do not eat a lot of foods that are high in solid fats, added sugars, or sodium. Maintain a  healthy weight Body mass index (BMI) is a measurement that can be used to identify possible weight problems. It estimates body fat based on height and weight. Your health care provider can help determine your BMI and help you achieve or maintain a healthy weight. Get regular exercise Get regular exercise. This is one of the most important things you can do for your health. Most adults should: Exercise for  at least 150 minutes each week. The exercise should increase your heart rate and make you sweat (moderate-intensity exercise). Do strengthening exercises at least twice a week. This is in addition to the moderate-intensity exercise. Spend less time sitting. Even light physical activity can be beneficial. Watch cholesterol and blood lipids Have your blood tested for lipids and cholesterol at 50 years of age, then have this test every 5 years. You may need to have your cholesterol levels checked more often if: Your lipid or cholesterol levels are high. You are older than 50 years of age. You are at high risk for heart disease. What should I know about cancer screening? Many types of cancers can be detected early and may often be prevented. Depending on your health history and family history, you may need to have cancer screening at various ages. This may include screening for: Colorectal cancer. Prostate cancer. Skin cancer. Lung cancer. What should I know about heart disease, diabetes, and high blood pressure? Blood pressure and heart disease High blood pressure causes heart disease and increases the risk of stroke. This is more likely to develop in people who have high blood pressure readings or are overweight. Talk with your health care provider about your target blood pressure readings. Have your blood pressure checked: Every 3-5 years if you are 70-73 years of age. Every year if you are 69 years old or older. If you are between the ages of 37 and 54 and are a current or former smoker, ask your health care provider if you should have a one-time screening for abdominal aortic aneurysm (AAA). Diabetes Have regular diabetes screenings. This checks your fasting blood sugar level. Have the screening done: Once every three years after age 87 if you are at a normal weight and have a low risk for diabetes. More often and at a younger age if you are overweight or have a high risk for  diabetes. What should I know about preventing infection? Hepatitis B If you have a higher risk for hepatitis B, you should be screened for this virus. Talk with your health care provider to find out if you are at risk for hepatitis B infection. Hepatitis C Blood testing is recommended for: Everyone born from [birth year] through 1963. Anyone with known risk factors for hepatitis C. Sexually transmitted infections (STIs) You should be screened each year for STIs, including gonorrhea and chlamydia, if: You are sexually active and are younger than 50 years of age. You are older than 50 years of age and your health care provider tells you that you are at risk for this type of infection. Your sexual activity has changed since you were last screened, and you are at increased risk for chlamydia or gonorrhea. Ask your health care provider if you are at risk. Ask your health care provider about whether you are at high risk for HIV. Your health care provider may recommend a prescription medicine to help prevent HIV infection. If you choose to take medicine to prevent HIV, you should first get tested for HIV. You should then be tested every 3  months for as long as you are taking the medicine. Follow these instructions at home: Alcohol use Do not drink alcohol if your health care provider tells you not to drink. If you drink alcohol: Limit how much you have to 0-2 drinks a day. Know how much alcohol is in your drink. In the [place] one drink equals one 12 oz bottle of beer (355 mL), one 5 oz glass of wine (148 mL), or one 1 oz glass of hard liquor (44 mL). Lifestyle Do not use any products that contain nicotine or tobacco. These products include cigarettes, chewing tobacco, and vaping devices, such as e-cigarettes. If you need help quitting, ask your health care provider. Do not use street drugs. Do not share needles. Ask your health care provider for help if you need support or information about quitting  drugs. General instructions Schedule regular health, dental, and eye exams. Stay current with your vaccines. Tell your health care provider if: You often feel depressed. You have ever been abused or do not feel safe at home. Summary Adopting a healthy lifestyle and getting preventive care are important in promoting health and wellness. Follow your health care provider's instructions about healthy diet, exercising, and getting tested or screened for diseases. Follow your health care provider's instructions on monitoring your cholesterol and blood pressure. This information is not intended to replace advice given to you by your health care provider. Make sure you discuss any questions you have with your health care provider. Document Revised: 07/09/2020 Document Reviewed: 07/09/2020 Elsevier Patient Education  2023 [employer] Inc[employer]     Edwina Barth, MD [facility]

## 2022-06-17 ENCOUNTER — Encounter: Payer: Self-pay | Admitting: Emergency Medicine

## 2022-06-17 DIAGNOSIS — E1169 Type 2 diabetes mellitus with other specified complication: Secondary | ICD-10-CM

## 2022-06-17 LAB — HEPATITIS C ANTIBODY: Hepatitis C Ab: NONREACTIVE

## 2022-06-18 NOTE — Telephone Encounter (Signed)
Testosterone is a little bit on the low side.  Recommend better diet and nutrition and more exercise.  Thanks.

## 2022-07-11 MED ORDER — EMPAGLIFLOZIN 10 MG PO TABS
10.0000 mg | ORAL_TABLET | Freq: Every day | ORAL | 3 refills | Status: AC
Start: 2022-07-11 — End: 2023-07-06

## 2022-07-11 NOTE — Addendum Note (Signed)
Addended by: Jerrell Belfast on: 07/11/2022 08:51 AM   Modules accepted: Orders

## 2022-09-26 ENCOUNTER — Other Ambulatory Visit (HOSPITAL_COMMUNITY): Payer: Self-pay

## 2022-12-16 ENCOUNTER — Ambulatory Visit: Payer: PRIVATE HEALTH INSURANCE | Admitting: Emergency Medicine

## 2023-06-22 ENCOUNTER — Encounter: Payer: PRIVATE HEALTH INSURANCE | Admitting: Emergency Medicine

## 2023-06-29 ENCOUNTER — Encounter: Payer: PRIVATE HEALTH INSURANCE | Admitting: Emergency Medicine

## 2023-07-08 ENCOUNTER — Ambulatory Visit: Payer: PRIVATE HEALTH INSURANCE | Admitting: Emergency Medicine

## 2023-07-08 ENCOUNTER — Encounter: Payer: Self-pay | Admitting: Emergency Medicine

## 2023-07-08 VITALS — BP 130/92 | HR 75 | Temp 98.1°F | Ht 72.0 in | Wt 210.0 lb

## 2023-07-08 DIAGNOSIS — Z7984 Long term (current) use of oral hypoglycemic drugs: Secondary | ICD-10-CM | POA: Diagnosis not present

## 2023-07-08 DIAGNOSIS — Z13228 Encounter for screening for other metabolic disorders: Secondary | ICD-10-CM

## 2023-07-08 DIAGNOSIS — E785 Hyperlipidemia, unspecified: Secondary | ICD-10-CM | POA: Diagnosis not present

## 2023-07-08 DIAGNOSIS — Z1329 Encounter for screening for other suspected endocrine disorder: Secondary | ICD-10-CM

## 2023-07-08 DIAGNOSIS — I10 Essential (primary) hypertension: Secondary | ICD-10-CM | POA: Diagnosis not present

## 2023-07-08 DIAGNOSIS — Z130 Encounter for screening for diseases of the blood and blood-forming organs and certain disorders involving the immune mechanism: Secondary | ICD-10-CM

## 2023-07-08 DIAGNOSIS — E1169 Type 2 diabetes mellitus with other specified complication: Secondary | ICD-10-CM

## 2023-07-08 DIAGNOSIS — G4733 Obstructive sleep apnea (adult) (pediatric): Secondary | ICD-10-CM | POA: Diagnosis not present

## 2023-07-08 DIAGNOSIS — Z0001 Encounter for general adult medical examination with abnormal findings: Secondary | ICD-10-CM

## 2023-07-08 LAB — CBC WITH DIFFERENTIAL/PLATELET
Basophils Absolute: 0.1 10*3/uL (ref 0.0–0.1)
Basophils Relative: 1 % (ref 0.0–3.0)
Eosinophils Absolute: 0.2 10*3/uL (ref 0.0–0.7)
Eosinophils Relative: 3.5 % (ref 0.0–5.0)
HCT: 52.7 % — ABNORMAL HIGH (ref 39.0–52.0)
Hemoglobin: 16.9 g/dL (ref 13.0–17.0)
Lymphocytes Relative: 36 % (ref 12.0–46.0)
Lymphs Abs: 2 10*3/uL (ref 0.7–4.0)
MCHC: 32 g/dL (ref 30.0–36.0)
MCV: 78.7 fl (ref 78.0–100.0)
Monocytes Absolute: 0.7 10*3/uL (ref 0.1–1.0)
Monocytes Relative: 12 % (ref 3.0–12.0)
Neutro Abs: 2.7 10*3/uL (ref 1.4–7.7)
Neutrophils Relative %: 47.5 % (ref 43.0–77.0)
Platelets: 223 10*3/uL (ref 150.0–400.0)
RBC: 6.7 Mil/uL — ABNORMAL HIGH (ref 4.22–5.81)
RDW: 16.6 % — ABNORMAL HIGH (ref 11.5–15.5)
WBC: 5.6 10*3/uL (ref 4.0–10.5)

## 2023-07-08 LAB — HEMOGLOBIN A1C: Hgb A1c MFr Bld: 8 % — ABNORMAL HIGH (ref 4.6–6.5)

## 2023-07-08 LAB — COMPREHENSIVE METABOLIC PANEL WITH GFR
ALT: 23 U/L (ref 0–53)
AST: 19 U/L (ref 0–37)
Albumin: 4.4 g/dL (ref 3.5–5.2)
Alkaline Phosphatase: 98 U/L (ref 39–117)
BUN: 12 mg/dL (ref 6–23)
CO2: 28 meq/L (ref 19–32)
Calcium: 9.2 mg/dL (ref 8.4–10.5)
Chloride: 99 meq/L (ref 96–112)
Creatinine, Ser: 1.16 mg/dL (ref 0.40–1.50)
GFR: 73.42 mL/min (ref >60.00)
Glucose, Bld: 128 mg/dL — ABNORMAL HIGH (ref 70–99)
Potassium: 4 meq/L (ref 3.5–5.1)
Sodium: 136 meq/L (ref 135–145)
Total Bilirubin: 0.4 mg/dL (ref 0.2–1.2)
Total Protein: 7.2 g/dL (ref 6.0–8.3)

## 2023-07-08 LAB — LIPID PANEL
Cholesterol: 279 mg/dL — ABNORMAL HIGH (ref 0–200)
HDL: 36.1 mg/dL — ABNORMAL LOW (ref >39.00)
LDL Cholesterol: 165 mg/dL — ABNORMAL HIGH (ref 0–99)
NonHDL: 242.48
Total CHOL/HDL Ratio: 8
Triglycerides: 386 mg/dL — ABNORMAL HIGH (ref 0.0–149.0)
VLDL: 77.2 mg/dL — ABNORMAL HIGH (ref 0.0–40.0)

## 2023-07-08 MED ORDER — METFORMIN HCL 500 MG PO TABS
500.0000 mg | ORAL_TABLET | Freq: Two times a day (BID) | ORAL | 4 refills | Status: AC
Start: 2023-07-08 — End: ?

## 2023-07-08 NOTE — Patient Instructions (Signed)
 Health Maintenance, Male  Adopting a healthy lifestyle and getting preventive care are important in promoting health and wellness. Ask your health care provider about:  The right schedule for you to have regular tests and exams.  Things you can do on your own to prevent diseases and keep yourself healthy.  What should I know about diet, weight, and exercise?  Eat a healthy diet    Eat a diet that includes plenty of vegetables, fruits, low-fat dairy products, and lean protein.  Do not eat a lot of foods that are high in solid fats, added sugars, or sodium.  Maintain a healthy weight  Body mass index (BMI) is a measurement that can be used to identify possible weight problems. It estimates body fat based on height and weight. Your health care provider can help determine your BMI and help you achieve or maintain a healthy weight.  Get regular exercise  Get regular exercise. This is one of the most important things you can do for your health. Most adults should:  Exercise for at least 150 minutes each week. The exercise should increase your heart rate and make you sweat (moderate-intensity exercise).  Do strengthening exercises at least twice a week. This is in addition to the moderate-intensity exercise.  Spend less time sitting. Even light physical activity can be beneficial.  Watch cholesterol and blood lipids  Have your blood tested for lipids and cholesterol at 51 years of age, then have this test every 5 years.  You may need to have your cholesterol levels checked more often if:  Your lipid or cholesterol levels are high.  You are older than 51 years of age.  You are at high risk for heart disease.  What should I know about cancer screening?  Many types of cancers can be detected early and may often be prevented. Depending on your health history and family history, you may need to have cancer screening at various ages. This may include screening for:  Colorectal cancer.  Prostate cancer.  Skin cancer.  Lung  cancer.  What should I know about heart disease, diabetes, and high blood pressure?  Blood pressure and heart disease  High blood pressure causes heart disease and increases the risk of stroke. This is more likely to develop in people who have high blood pressure readings or are overweight.  Talk with your health care provider about your target blood pressure readings.  Have your blood pressure checked:  Every 3-5 years if you are 9-95 years of age.  Every year if you are 85 years old or older.  If you are between the ages of 29 and 29 and are a current or former smoker, ask your health care provider if you should have a one-time screening for abdominal aortic aneurysm (AAA).  Diabetes  Have regular diabetes screenings. This checks your fasting blood sugar level. Have the screening done:  Once every three years after age 23 if you are at a normal weight and have a low risk for diabetes.  More often and at a younger age if you are overweight or have a high risk for diabetes.  What should I know about preventing infection?  Hepatitis B  If you have a higher risk for hepatitis B, you should be screened for this virus. Talk with your health care provider to find out if you are at risk for hepatitis B infection.  Hepatitis C  Blood testing is recommended for:  Everyone born from [birth year] through 1963.  Anyone  with known risk factors for hepatitis C.  Sexually transmitted infections (STIs)  You should be screened each year for STIs, including gonorrhea and chlamydia, if:  You are sexually active and are younger than 51 years of age.  You are older than 51 years of age and your health care provider tells you that you are at risk for this type of infection.  Your sexual activity has changed since you were last screened, and you are at increased risk for chlamydia or gonorrhea. Ask your health care provider if you are at risk.  Ask your health care provider about whether you are at high risk for HIV. Your health care provider  may recommend a prescription medicine to help prevent HIV infection. If you choose to take medicine to prevent HIV, you should first get tested for HIV. You should then be tested every 3 months for as long as you are taking the medicine.  Follow these instructions at home:  Alcohol use  Do not drink alcohol if your health care provider tells you not to drink.  If you drink alcohol:  Limit how much you have to 0-2 drinks a day.  Know how much alcohol is in your drink. In the [place] one drink equals one 12 oz bottle of beer (355 mL), one 5 oz glass of wine (148 mL), or one 1 oz glass of hard liquor (44 mL).  Lifestyle  Do not use any products that contain nicotine or tobacco. These products include cigarettes, chewing tobacco, and vaping devices, such as e-cigarettes. If you need help quitting, ask your health care provider.  Do not use street drugs.  Do not share needles.  Ask your health care provider for help if you need support or information about quitting drugs.  General instructions  Schedule regular health, dental, and eye exams.  Stay current with your vaccines.  Tell your health care provider if:  You often feel depressed.  You have ever been abused or do not feel safe at home.  Summary  Adopting a healthy lifestyle and getting preventive care are important in promoting health and wellness.  Follow your health care provider's instructions about healthy diet, exercising, and getting tested or screened for diseases.  Follow your health care provider's instructions on monitoring your cholesterol and blood pressure.  This information is not intended to replace advice given to you by your health care provider. Make sure you discuss any questions you have with your health care provider.  Document Revised: 07/09/2020 Document Reviewed: 07/09/2020  Elsevier Patient Education  2024 [employer].

## 2023-07-08 NOTE — Assessment & Plan Note (Signed)
 Not tolerating CPAP mask very well

## 2023-07-08 NOTE — Progress Notes (Signed)
 Connor Tate 53 y.o.   No chief complaint on file.   HISTORY OF PRESENT ILLNESS: This is a 52 y.o. male A1A here for annual exam and follow-up on chronic medical conditions Overall doing well. Stopped rosuvastatin  due to musculoskeletal side effects. No other complaints or medical concerns today.  HPI   Prior to Admission medications   Medication Sig Start Date End Date Taking? Authorizing Provider  Cetirizine HCl (ZYRTEC PO) Take by mouth daily.   Yes [provider]  Omeprazole Magnesium (PRILOSEC OTC PO) Take by mouth daily.   Yes [provider]  metFORMIN  (GLUCOPHAGE ) 500 MG tablet Take 1 tablet (500 mg total) by mouth 2 (two) times daily with a meal. 07/08/23   Sanay Belmar, Isidro Margo, MD  rosuvastatin  (CRESTOR ) 20 MG tablet Take 1 tablet (20 mg total) by mouth daily. 06/16/22 06/11/23  Elvira Hammersmith, MD    Allergies  Allergen Reactions   Sulfa Antibiotics Rash    Itchy and burning palms   Sulfasalazine Rash    Itchy and burning palms    Patient Active Problem List   Diagnosis Date Noted   Uncontrolled type 2 diabetes mellitus with hyperglycemia (HCC) 06/24/2021   Dyslipidemia 08/25/2017   Dyslipidemia associated with type 2 diabetes mellitus (HCC) 08/28/2015   Essential hypertension 08/28/2015   Wolff-Parkinson-White (WPW) syndrome, type A 08/28/2015   Insomnia with sleep apnea 08/28/2015   OSA on CPAP 08/03/2014    Past Medical History:  Diagnosis Date   Atypical chest pain    Diabetes mellitus without complication (HCC)    pre-DM- on meds for prevention   GERD (gastroesophageal reflux disease)    on meds   Hyperlipidemia    - with diet change   L4-L5 disc bulge    Right bundle branch block    Seasonal allergies    Sleep apnea    does not use CPAP   Snoring    Wolff-Parkinson-White (WPW) syndrome     Past Surgical History:  Procedure Laterality Date   SHOULDER ARTHROSCOPY WITH ROTATOR CUFF REPAIR AND OPEN BICEPS TENODESIS  Bilateral 2012   and labrum repair   WISDOM TOOTH EXTRACTION      Social History   Socioeconomic History   Marital status: Married    Spouse name: Not on file   Number of children: Not on file   Years of education: Not on file   Highest education level: Not on file  Occupational History   Not on file  Tobacco Use   Smoking status: Never   Smokeless tobacco: Never  Vaping Use   Vaping status: Never Used  Substance and Sexual Activity   Alcohol use: Yes    Alcohol/week: 12.0 standard drinks of alcohol    Types: 12 Standard drinks or equivalent per week   Drug use: No   Sexual activity: Yes    Partners: Female  Other Topics Concern   Not on file  Social History Narrative   Not on file   Social Drivers of Health   Financial Resource Strain: Not on file  Food Insecurity: Not on file  Transportation Needs: Not on file  Physical Activity: Not on file  Stress: Not on file  Social Connections: Not on file  Intimate Partner Violence: Not on file    Family History  Problem Relation Age of Onset   Heart disease Mother    Alcohol abuse Neg Hx    Cancer Neg Hx    COPD Neg Hx  Depression Neg Hx    Diabetes Neg Hx    Drug abuse Neg Hx    Early death Neg Hx    Hearing loss Neg Hx    Hyperlipidemia Neg Hx    Hypertension Neg Hx    Kidney disease Neg Hx    Stroke Neg Hx    Colon polyps Neg Hx    Colon cancer Neg Hx    Rectal cancer Neg Hx    Stomach cancer Neg Hx      Review of Systems  Constitutional: Negative.  Negative for chills and fever.  HENT: Negative.  Negative for congestion and sore throat.   Respiratory: Negative.  Negative for cough and shortness of breath.   Cardiovascular: Negative.  Negative for chest pain and palpitations.  Gastrointestinal:  Negative for abdominal pain, diarrhea, nausea and vomiting.  Genitourinary: Negative.  Negative for dysuria and hematuria.  Skin: Negative.  Negative for rash.  Neurological: Negative.  Negative for  dizziness and headaches.  All other systems reviewed and are negative.   Vitals:   07/08/23 1418  BP: (!) 130/92  Pulse: 75  Temp: 98.1 F (36.7 C)  SpO2: 95%    Physical Exam Vitals reviewed.  Constitutional:      Appearance: Normal appearance.  HENT:     Head: Normocephalic.     Right Ear: Tympanic membrane, ear canal and external ear normal.     Left Ear: Tympanic membrane, ear canal and external ear normal.     Mouth/Throat:     Mouth: Mucous membranes are moist.     Pharynx: Oropharynx is clear.  Eyes:     Extraocular Movements: Extraocular movements intact.     Conjunctiva/sclera: Conjunctivae normal.     Pupils: Pupils are equal, round, and reactive to light.  Cardiovascular:     Rate and Rhythm: Normal rate and regular rhythm.     Pulses: Normal pulses.     Heart sounds: Normal heart sounds.  Pulmonary:     Effort: Pulmonary effort is normal.     Breath sounds: Normal breath sounds.  Abdominal:     Palpations: Abdomen is soft.     Tenderness: There is no abdominal tenderness.  Musculoskeletal:     Cervical back: No tenderness.  Lymphadenopathy:     Cervical: No cervical adenopathy.  Skin:    General: Skin is warm and dry.     Capillary Refill: Capillary refill takes less than 2 seconds.  Neurological:     General: No focal deficit present.     Mental Status: He is alert and oriented to person, place, and time.  Psychiatric:        Mood and Affect: Mood normal.        Behavior: Behavior normal.      ASSESSMENT & PLAN: Problem List Items Addressed This Visit       Cardiovascular and Mediastinum   Essential hypertension   BP Readings from Last 3 Encounters:  07/08/23 (!) 130/92  06/16/22 138/88  06/24/21 120/70  Elevated blood pressure reading in the office today Cardiovascular risks associated with hypertension discussed Diet and nutrition discussed Benefits of exercise discussed Advised to monitor blood pressure readings at home daily for  the next several weeks and keep a log.  Advised to contact the office if numbers persistently abnormal.       Relevant Orders   CBC with Differential/Platelet   Hemoglobin A1c   Comprehensive metabolic panel with GFR   Lipid panel  Respiratory   OSA on CPAP   Not tolerating CPAP mask very well        Endocrine   Dyslipidemia associated with type 2 diabetes mellitus (HCC)   Well-controlled diabetes with hemoglobin A1c of 6.6 Continue metformin  500 mg twice a day along with Jardiance  10 mg daily Continue rosuvastatin  20 mg daily Cardiovascular risk associated with dyslipidemia and diabetes discussed Diet and nutrition discussed Follow-up in 6 months      Relevant Medications   metFORMIN  (GLUCOPHAGE ) 500 MG tablet   Other Relevant Orders   CBC with Differential/Platelet   Hemoglobin A1c   Comprehensive metabolic panel with GFR   Lipid panel   Other Visit Diagnoses       Encounter for general adult medical examination with abnormal findings    -  Primary   Relevant Medications   metFORMIN  (GLUCOPHAGE ) 500 MG tablet   Other Relevant Orders   CBC with Differential/Platelet   Hemoglobin A1c   Comprehensive metabolic panel with GFR   Lipid panel     Screening for deficiency anemia       Relevant Orders   CBC with Differential/Platelet     Screening for endocrine, metabolic and immunity disorder       Relevant Orders   Comprehensive metabolic panel with GFR        Modifiable risk factors discussed with patient. Anticipatory guidance according to age provided. The following topics were also discussed: Social Determinants of Health Smoking.  Non-smoker Diet and nutrition Benefits of exercise Cancer screening and review of most recent colonoscopy report Vaccinations review and recommendations Cardiovascular risk assessment and need for blood work Mental health including depression and anxiety Fall and accident prevention  Patient Instructions  Health  Maintenance, Male Adopting a healthy lifestyle and getting preventive care are important in promoting health and wellness. Ask your health care provider about: The right schedule for you to have regular tests and exams. Things you can do on your own to prevent diseases and keep yourself healthy. What should I know about diet, weight, and exercise? Eat a healthy diet  Eat a diet that includes plenty of vegetables, fruits, low-fat dairy products, and lean protein. Do not eat a lot of foods that are high in solid fats, added sugars, or sodium. Maintain a healthy weight Body mass index (BMI) is a measurement that can be used to identify possible weight problems. It estimates body fat based on height and weight. Your health care provider can help determine your BMI and help you achieve or maintain a healthy weight. Get regular exercise Get regular exercise. This is one of the most important things you can do for your health. Most adults should: Exercise for at least 150 minutes each week. The exercise should increase your heart rate and make you sweat (moderate-intensity exercise). Do strengthening exercises at least twice a week. This is in addition to the moderate-intensity exercise. Spend less time sitting. Even light physical activity can be beneficial. Watch cholesterol and blood lipids Have your blood tested for lipids and cholesterol at 51 years of age, then have this test every 5 years. You may need to have your cholesterol levels checked more often if: Your lipid or cholesterol levels are high. You are older than 51 years of age. You are at high risk for heart disease. What should I know about cancer screening? Many types of cancers can be detected early and may often be prevented. Depending on your health history and family history, you may  need to have cancer screening at various ages. This may include screening for: Colorectal cancer. Prostate cancer. Skin cancer. Lung cancer. What  should I know about heart disease, diabetes, and high blood pressure? Blood pressure and heart disease High blood pressure causes heart disease and increases the risk of stroke. This is more likely to develop in people who have high blood pressure readings or are overweight. Talk with your health care provider about your target blood pressure readings. Have your blood pressure checked: Every 3-5 years if you are 30-3 years of age. Every year if you are 89 years old or older. If you are between the ages of 76 and 97 and are a current or former smoker, ask your health care provider if you should have a one-time screening for abdominal aortic aneurysm (AAA). Diabetes Have regular diabetes screenings. This checks your fasting blood sugar level. Have the screening done: Once every three years after age 44 if you are at a normal weight and have a low risk for diabetes. More often and at a younger age if you are overweight or have a high risk for diabetes. What should I know about preventing infection? Hepatitis B If you have a higher risk for hepatitis B, you should be screened for this virus. Talk with your health care provider to find out if you are at risk for hepatitis B infection. Hepatitis C Blood testing is recommended for: Everyone born from [birth year] through 1963. Anyone with known risk factors for hepatitis C. Sexually transmitted infections (STIs) You should be screened each year for STIs, including gonorrhea and chlamydia, if: You are sexually active and are younger than 51 years of age. You are older than 51 years of age and your health care provider tells you that you are at risk for this type of infection. Your sexual activity has changed since you were last screened, and you are at increased risk for chlamydia or gonorrhea. Ask your health care provider if you are at risk. Ask your health care provider about whether you are at high risk for HIV. Your health care provider may recommend a  prescription medicine to help prevent HIV infection. If you choose to take medicine to prevent HIV, you should first get tested for HIV. You should then be tested every 3 months for as long as you are taking the medicine. Follow these instructions at home: Alcohol use Do not drink alcohol if your health care provider tells you not to drink. If you drink alcohol: Limit how much you have to 0-2 drinks a day. Know how much alcohol is in your drink. In the [place] one drink equals one 12 oz bottle of beer (355 mL), one 5 oz glass of wine (148 mL), or one 1 oz glass of hard liquor (44 mL). Lifestyle Do not use any products that contain nicotine or tobacco. These products include cigarettes, chewing tobacco, and vaping devices, such as e-cigarettes. If you need help quitting, ask your health care provider. Do not use street drugs. Do not share needles. Ask your health care provider for help if you need support or information about quitting drugs. General instructions Schedule regular health, dental, and eye exams. Stay current with your vaccines. Tell your health care provider if: You often feel depressed. You have ever been abused or do not feel safe at home. Summary Adopting a healthy lifestyle and getting preventive care are important in promoting health and wellness. Follow your health care provider's instructions about healthy diet, exercising, and  getting tested or screened for diseases. Follow your health care provider's instructions on monitoring your cholesterol and blood pressure. This information is not intended to replace advice given to you by your health care provider. Make sure you discuss any questions you have with your health care provider. Document Revised: 07/09/2020 Document Reviewed: 07/09/2020 Elsevier Patient Education  2024 [employer]     Connor Small, MD [facility]

## 2023-07-08 NOTE — Assessment & Plan Note (Signed)
 BP Readings from Last 3 Encounters:  07/08/23 (!) 130/92  06/16/22 138/88  06/24/21 120/70  Elevated blood pressure reading in the office today Cardiovascular risks associated with hypertension discussed Diet and nutrition discussed Benefits of exercise discussed Advised to monitor blood pressure readings at home daily for the next several weeks and keep a log.  Advised to contact the office if numbers persistently abnormal.

## 2023-07-08 NOTE — Assessment & Plan Note (Signed)
Well-controlled diabetes with hemoglobin A1c of 6.6 Continue metformin 500 mg twice a day along with Jardiance 10 mg daily Continue rosuvastatin 20 mg daily Cardiovascular risk associated with dyslipidemia and diabetes discussed Diet and nutrition discussed Follow-up in 6 months

## 2023-07-09 ENCOUNTER — Encounter: Payer: Self-pay | Admitting: Emergency Medicine

## 2023-07-16 ENCOUNTER — Other Ambulatory Visit: Payer: Self-pay | Admitting: Radiology

## 2023-07-16 ENCOUNTER — Telehealth: Payer: Self-pay | Admitting: Emergency Medicine

## 2023-07-16 DIAGNOSIS — E1169 Type 2 diabetes mellitus with other specified complication: Secondary | ICD-10-CM

## 2023-07-16 MED ORDER — EMPAGLIFLOZIN 10 MG PO TABS: 10.0000 mg | ORAL_TABLET | Freq: Every day | ORAL | 3 refills | Status: AC

## 2023-07-16 NOTE — Telephone Encounter (Signed)
 Spoke with patient and informed him a new rx for the jardiance  has been sent to the pharmacy requested

## 2023-07-16 NOTE — Telephone Encounter (Signed)
 Copied from CRM [id number]. Topic: Clinical - Prescription Issue >> Jul 16, 2023 12:19 PM Dewanda Foots wrote: Reason for CRM: empagliflozin  (JARDIANCE ) 10 MG TABS tablet.  Pt states he just had a physical last week but this medication was not refilled. He is completely out of the  medication and it looks like it was discontinued on 5/5 and would like to know why that happened and what we need to do to restore the medication.   90-day supply through the [facility] - [place], [place] - [address] [place] [address] Phone: [phone] Fax: [phone] Hours: Not open 24 hours  Pharmacy please.   Please call patient at [phone] so he knows what is going on please.

## 2023-12-07 ENCOUNTER — Ambulatory Visit: Payer: Self-pay | Admitting: Emergency Medicine

## 2023-12-07 ENCOUNTER — Encounter: Payer: Self-pay | Admitting: Emergency Medicine

## 2023-12-07 VITALS — BP 132/94 | HR 85 | Temp 98.3°F | Ht 72.0 in | Wt 205.0 lb

## 2023-12-07 DIAGNOSIS — E1169 Type 2 diabetes mellitus with other specified complication: Secondary | ICD-10-CM

## 2023-12-07 DIAGNOSIS — G4733 Obstructive sleep apnea (adult) (pediatric): Secondary | ICD-10-CM

## 2023-12-07 DIAGNOSIS — I152 Hypertension secondary to endocrine disorders: Secondary | ICD-10-CM | POA: Diagnosis not present

## 2023-12-07 DIAGNOSIS — Z23 Encounter for immunization: Secondary | ICD-10-CM

## 2023-12-07 DIAGNOSIS — E1159 Type 2 diabetes mellitus with other circulatory complications: Secondary | ICD-10-CM | POA: Diagnosis not present

## 2023-12-07 DIAGNOSIS — E785 Hyperlipidemia, unspecified: Secondary | ICD-10-CM | POA: Diagnosis not present

## 2023-12-07 DIAGNOSIS — N5201 Erectile dysfunction due to arterial insufficiency: Secondary | ICD-10-CM | POA: Diagnosis not present

## 2023-12-07 DIAGNOSIS — R399 Unspecified symptoms and signs involving the genitourinary system: Secondary | ICD-10-CM

## 2023-12-07 LAB — HEMOGLOBIN A1C: Hgb A1c MFr Bld: 7.9 % — ABNORMAL HIGH (ref 4.6–6.5)

## 2023-12-07 LAB — LIPID PANEL
Cholesterol: 233 mg/dL — ABNORMAL HIGH (ref 0–200)
HDL: 43.9 mg/dL (ref >39.00)
LDL Cholesterol: 162 mg/dL — ABNORMAL HIGH (ref 0–99)
NonHDL: 188.69
Total CHOL/HDL Ratio: 5
Triglycerides: 134 mg/dL (ref 0.0–149.0)
VLDL: 26.8 mg/dL (ref 0.0–40.0)

## 2023-12-07 LAB — COMPREHENSIVE METABOLIC PANEL WITH GFR
ALT: 21 U/L (ref 0–53)
AST: 17 U/L (ref 0–37)
Albumin: 4.8 g/dL (ref 3.5–5.2)
Alkaline Phosphatase: 89 U/L (ref 39–117)
BUN: 14 mg/dL (ref 6–23)
CO2: 23 meq/L (ref 19–32)
Calcium: 9.7 mg/dL (ref 8.4–10.5)
Chloride: 101 meq/L (ref 96–112)
Creatinine, Ser: 1.06 mg/dL (ref 0.40–1.50)
GFR: 81.56 mL/min (ref >60.00)
Glucose, Bld: 106 mg/dL — ABNORMAL HIGH (ref 70–99)
Potassium: 4.1 meq/L (ref 3.5–5.1)
Sodium: 139 meq/L (ref 135–145)
Total Bilirubin: 0.6 mg/dL (ref 0.2–1.2)
Total Protein: 7.2 g/dL (ref 6.0–8.3)

## 2023-12-07 LAB — PSA: PSA: 1.3 ng/mL (ref 0.10–4.00)

## 2023-12-07 MED ORDER — TAMSULOSIN HCL 0.4 MG PO CAPS
0.4000 mg | ORAL_CAPSULE | Freq: Every day | ORAL | 3 refills | Status: AC
Start: 2023-12-07 — End: ?

## 2023-12-07 MED ORDER — SILDENAFIL CITRATE 100 MG PO TABS: 50.0000 mg | ORAL_TABLET | Freq: Every day | ORAL | 11 refills | Status: AC | PRN

## 2023-12-07 NOTE — Progress Notes (Signed)
 Connor Tate 53 y.o.   Chief Complaint  Patient presents with   Urinary Incontinence    Patient states the urination leakage has been going on for about a year. ED he just noticed this because because him and his wife was not intimate for awhile because she was battling breast cancer.    HISTORY OF PRESENT ILLNESS: This is a 53 y.o. male A1A complaining of lower urinary tract symptoms and also erectile dysfunction on and off for about a year. No other associated symptoms. History of diabetes on metformin  and Jardiance  No other complaints or medical concerns today   HPI   Prior to Admission medications   Medication Sig Start Date End Date Taking? Authorizing Provider  Cetirizine HCl (ZYRTEC PO) Take by mouth daily.   Yes [provider]  empagliflozin  (JARDIANCE ) 10 MG TABS tablet Take 1 tablet (10 mg total) by mouth daily before breakfast. 07/16/23  Yes Tyannah Sane, Connor Schanz, MD  metFORMIN  (GLUCOPHAGE ) 500 MG tablet Take 1 tablet (500 mg total) by mouth 2 (two) times daily with a meal. 07/08/23  Yes Gotti Alwin, Connor Schanz, MD  Omeprazole Magnesium (PRILOSEC OTC PO) Take by mouth daily.   Yes [provider]  rosuvastatin  (CRESTOR ) 20 MG tablet Take 1 tablet (20 mg total) by mouth daily. Patient not taking: Reported on 12/07/2023 06/16/22 06/11/23  Purcell Connor Schanz, MD    Allergies  Allergen Reactions   Sulfa Antibiotics Rash    Itchy and burning palms   Sulfasalazine Rash    Itchy and burning palms    Patient Active Problem List   Diagnosis Date Noted   Uncontrolled type 2 diabetes mellitus with hyperglycemia (HCC) 06/24/2021   Dyslipidemia 08/25/2017   Dyslipidemia associated with type 2 diabetes mellitus (HCC) 08/28/2015   Essential hypertension 08/28/2015   Wolff-Parkinson-White (WPW) syndrome, type A 08/28/2015   Insomnia with sleep apnea 08/28/2015   OSA on CPAP 08/03/2014    Past Medical History:  Diagnosis Date   Atypical chest pain     Diabetes mellitus without complication (HCC)    pre-DM- on meds for prevention   GERD (gastroesophageal reflux disease)    on meds   Hyperlipidemia    - with diet change   L4-L5 disc bulge    Right bundle branch block    Seasonal allergies    Sleep apnea    does not use CPAP   Snoring    Wolff-Parkinson-White (WPW) syndrome     Past Surgical History:  Procedure Laterality Date   SHOULDER ARTHROSCOPY WITH ROTATOR CUFF REPAIR AND OPEN BICEPS TENODESIS Bilateral 2012   and labrum repair   WISDOM TOOTH EXTRACTION      Social History   Socioeconomic History   Marital status: Married    Spouse name: Not on file   Number of children: Not on file   Years of education: Not on file   Highest education level: Not on file  Occupational History   Not on file  Tobacco Use   Smoking status: Never   Smokeless tobacco: Never  Vaping Use   Vaping status: Never Used  Substance and Sexual Activity   Alcohol use: Yes    Alcohol/week: 12.0 standard drinks of alcohol    Types: 12 Standard drinks or equivalent per week   Drug use: No   Sexual activity: Yes    Partners: Female  Other Topics Concern   Not on file  Social History Narrative   Not on file   Social Drivers  of Health   Financial Resource Strain: Not on file  Food Insecurity: Not on file  Transportation Needs: Not on file  Physical Activity: Not on file  Stress: Not on file  Social Connections: Not on file  Intimate Partner Violence: Not on file    Family History  Problem Relation Age of Onset   Heart disease Mother    Alcohol abuse Neg Hx    Cancer Neg Hx    COPD Neg Hx    Depression Neg Hx    Diabetes Neg Hx    Drug abuse Neg Hx    Early death Neg Hx    Hearing loss Neg Hx    Hyperlipidemia Neg Hx    Hypertension Neg Hx    Kidney disease Neg Hx    Stroke Neg Hx    Colon polyps Neg Hx    Colon cancer Neg Hx    Rectal cancer Neg Hx    Stomach cancer Neg Hx      Review of Systems  Constitutional:  Negative.  Negative for chills and fever.  HENT: Negative.  Negative for congestion and sore throat.   Respiratory: Negative.  Negative for cough and shortness of breath.   Cardiovascular: Negative.  Negative for chest pain and palpitations.  Gastrointestinal:  Negative for abdominal pain, diarrhea, nausea and vomiting.  Genitourinary:  Positive for frequency and urgency. Negative for dysuria and hematuria.       Nocturia  Skin: Negative.  Negative for rash.  Neurological: Negative.  Negative for dizziness and headaches.  All other systems reviewed and are negative.   Vitals:   12/07/23 1045  BP: (!) 132/94  Pulse: 85  Temp: 98.3 F (36.8 C)  SpO2: 99%    Physical Exam Vitals reviewed.  Constitutional:      Appearance: Normal appearance.  HENT:     Head: Normocephalic.  Eyes:     Extraocular Movements: Extraocular movements intact.  Cardiovascular:     Rate and Rhythm: Normal rate and regular rhythm.     Pulses: Normal pulses.     Heart sounds: Normal heart sounds.  Pulmonary:     Effort: Pulmonary effort is normal.     Breath sounds: Normal breath sounds.  Abdominal:     Palpations: Abdomen is soft.     Tenderness: There is no abdominal tenderness.  Musculoskeletal:     Cervical back: No tenderness.  Lymphadenopathy:     Cervical: No cervical adenopathy.  Skin:    General: Skin is warm and dry.     Capillary Refill: Capillary refill takes less than 2 seconds.  Neurological:     General: No focal deficit present.     Mental Status: He is alert and oriented to person, place, and time.  Psychiatric:        Mood and Affect: Mood normal.        Behavior: Behavior normal.      ASSESSMENT & PLAN: A total of 40 minutes was spent with the patient and counseling/coordination of care regarding preparing for this visit, review of most recent office visit notes, review of multiple chronic medical conditions and their management, review of all medications, review of most  recent bloodwork results, review of health maintenance items, education on nutrition, prognosis, documentation, and need for follow up.  Problem List Items Addressed This Visit       Cardiovascular and Mediastinum   Hypertension associated with diabetes (HCC)   BP Readings from Last 3 Encounters:  12/07/23 ROLLEN)  132/94  07/08/23 (!) 130/92  06/16/22 138/88  Elevated blood pressure reading in the office today Cardiovascular risks associated with hypertension discussed Diet and nutrition discussed Benefits of exercise discussed Advised to monitor blood pressure readings at home daily for the next several weeks and keep a log.  Advised to contact the office if numbers persistently abnormal. Lab Results  Component Value Date   HGBA1C 8.0 (H) 07/08/2023  Presently on metformin  500 mg twice a day on Jardiance  10 mg daily Diet and nutrition discussed We will repeat A1c today        Relevant Medications   sildenafil (VIAGRA) 100 MG tablet   Other Relevant Orders   Hemoglobin A1c   Comprehensive metabolic panel with GFR   Erectile dysfunction due to arterial insufficiency   Recommend Viagra as needed Side effect discussed      Relevant Medications   sildenafil (VIAGRA) 100 MG tablet     Respiratory   OSA on CPAP   Not tolerating CPAP mask very well  Continues to monitor evolution of inspire approach and treatment        Endocrine   Dyslipidemia associated with type 2 diabetes mellitus (HCC)   Hemoglobin A1c at 8.0.  Not at goal.  Will repeat today. Continue metformin  500 mg twice a day along with Jardiance  10 mg daily Continue rosuvastatin  20 mg daily Cardiovascular risk associated with dyslipidemia and diabetes discussed Diet and nutrition discussed Follow-up in 3 months      Relevant Orders   Hemoglobin A1c   Comprehensive metabolic panel with GFR   Lipid panel     Other   Lower urinary tract symptoms - Primary   With occasional leaking. Unable to fully empty  bladder Recommend PSA today Start Flomax 0.4 mg at bedtime Recommend urology evaluation Referral placed today      Relevant Medications   tamsulosin (FLOMAX) 0.4 MG CAPS capsule   Other Relevant Orders   PSA   Urinalysis   Urine Culture   Other Visit Diagnoses       Need for vaccination       Relevant Orders   Flu vaccine trivalent PF, 6mos and older(Flulaval,Afluria,Fluarix,Fluzone)      Patient Instructions  Diabetes: Carbohydrate Counting for Adults Carbohydrate counting is a method of keeping track of how many carbohydrates you eat. Eating carbohydrates increases the amount of sugar, also called glucose, in your blood. By counting how many carbohydrates you eat, you can improve how well you manage your blood sugar. This, in turn, helps you manage your diabetes. Carbohydrates are measured in grams (g) per serving. It's important to know how many carbohydrates (in grams or by serving size) you can have in each meal. This is different for every person. A dietitian can help you make a meal plan and calculate how many carbohydrates you should have at each meal and snack. What foods contain carbohydrates? Carbohydrates are found in these foods: Grains, such as breads and cereals. Dried beans and soy products. Starchy vegetables, such as potatoes, peas, and corn. Fruit and fruit juices. Milk and yogurt. Sweets and snack foods like cake, cookies, candy, chips, and soft drinks. How do I count carbohydrates in foods? There are two ways to count carbohydrates in food. You can read food labels or learn standard serving sizes of foods. You can use either of these methods or a combination of both. Using the Nutrition Facts label The Nutrition Facts list is included on the labels of almost all packaged foods  and drinks in the [place] It includes: The serving size. Information about nutrients in each serving. This includes the grams of carbohydrate per serving. To use the Nutrition Facts,  decide how many servings you will have. Then, multiply the number of servings by the number of carbohydrates per serving. The resulting number is the total grams of carbohydrates that you'll be having. Learning the standard serving sizes of foods When you eat carbohydrate foods that aren't packaged or don't include Nutrition Facts on the label, you need to measure the servings in order to count the grams of carbohydrates. Measure the foods that you'll eat with a food scale or measuring cup, if needed. Decide how many standard-size servings you'll eat. Multiply the number of servings by 15. For foods that contain carbohydrates, one serving equals 15 g of carbohydrates. For example, if you eat 2 cups or 10 oz (300 g) of strawberries, you'll have eaten 2 servings and 30 g of carbohydrates (2 servings x 15 g = 30 g). For foods that have more than one food mixed, such as soups and casseroles, you must count the carbohydrates in each food that's included. Here's a list of standard serving sizes for common carbohydrate-rich foods. Each of these servings has about 15 g of carbohydrates: 1 slice of bread. 1 six-inch (15 cm) tortilla. ? cup or 2 oz (53 g) of cooked rice or pasta.  cup or 3 oz (85 g) of cooked or canned, drained, and rinsed beans or lentils.  cup or 3 oz (85 g) of a starchy vegetable, such as peas, corn, or squash.  cup or 4 oz (120 g) of hot cereal.  cup or 3 oz (85 g) of boiled or mashed potatoes, or  or 3 oz (85 g) of a large baked potato.  cup or 4 fl oz (118 mL) of fruit juice. 1 cup or 8 fl oz (237 mL) of milk. 1 small or 4 oz (106 g) apple.  or 2 oz (63 g) of a medium banana. 1 cup or 5 oz (150 g) of strawberries. 3 cups or 1 oz (28.3 g) of popped popcorn. What is an example of carbohydrate counting? To calculate the grams of carbohydrates in this sample meal, follow the steps below. Sample meal 3 oz (85 g) chicken breast. ? cup or 4 oz (106 g) of brown rice.  cup or 3  oz (85 g) of corn. 1 cup or 8 fl oz (237 mL) of milk. 1 cup or 5 oz (150 g) of strawberries with sugar-free whipped topping. Carbohydrate calculation Identify the foods that have carbohydrates: Rice. Corn. Milk. Strawberries. Calculate how many servings you have of each food: 2 servings of rice. 1 serving of corn. 1 serving of milk. 1 serving of strawberries. Multiply each number of servings by 15 g: 2 servings of rice x 15 g = 30 g. 1 serving of corn x 15 g = 15 g. 1 serving of milk x 15 g = 15 g. 1 serving of strawberries x 15 g = 15 g. Add together all of the amounts to find the total grams of carbohydrates eaten: 30 g + 15 g + 15 g + 15 g = 75 g of carbohydrates total. Where to find more information To learn more, go to: American Diabetes Association at diabetes.org. Click Search and type carb counting. Find the link you need. Centers for Disease Control and Prevention at TonerProm[employer].no. Click Search and type diabetes. Find the link you need. Academy of Nutrition  and Dietetics: eatright.org This information is not intended to replace advice given to you by your health care provider. Make sure you discuss any questions you have with your health care provider. Document Revised: 02/04/2023 Document Reviewed: 02/04/2023 Elsevier Patient Education  2025 Elsevier Inc.      Connor Schaumann, MD [facility]

## 2023-12-07 NOTE — Assessment & Plan Note (Signed)
 With occasional leaking. Unable to fully empty bladder Recommend PSA today Start Flomax 0.4 mg at bedtime Recommend urology evaluation Referral placed today

## 2023-12-07 NOTE — Assessment & Plan Note (Signed)
 Recommend Viagra as needed Side effect discussed

## 2023-12-07 NOTE — Assessment & Plan Note (Signed)
 Not tolerating CPAP mask very well  Continues to monitor evolution of inspire approach and treatment

## 2023-12-07 NOTE — Patient Instructions (Signed)

## 2023-12-07 NOTE — Assessment & Plan Note (Signed)
 BP Readings from Last 3 Encounters:  12/07/23 (!) 132/94  07/08/23 (!) 130/92  06/16/22 138/88  Elevated blood pressure reading in the office today Cardiovascular risks associated with hypertension discussed Diet and nutrition discussed Benefits of exercise discussed Advised to monitor blood pressure readings at home daily for the next several weeks and keep a log.  Advised to contact the office if numbers persistently abnormal. Lab Results  Component Value Date   HGBA1C 8.0 (H) 07/08/2023  Presently on metformin  500 mg twice a day on Jardiance  10 mg daily Diet and nutrition discussed We will repeat A1c today

## 2023-12-07 NOTE — Assessment & Plan Note (Addendum)
 Hemoglobin A1c at 8.0.  Not at goal.  Will repeat today. Continue metformin  500 mg twice a day along with Jardiance  10 mg daily Continue rosuvastatin  20 mg daily Cardiovascular risk associated with dyslipidemia and diabetes discussed Diet and nutrition discussed Follow-up in 3 months

## 2023-12-12 ENCOUNTER — Encounter: Payer: Self-pay | Admitting: Emergency Medicine

## 2023-12-14 NOTE — Telephone Encounter (Signed)
 Before changing medication, recommend urology evaluation.  Please look into referral that was placed during last visit.  Thanks.

## 2023-12-15 ENCOUNTER — Other Ambulatory Visit: Payer: Self-pay | Admitting: Radiology

## 2023-12-15 DIAGNOSIS — R399 Unspecified symptoms and signs involving the genitourinary system: Secondary | ICD-10-CM

## 2023-12-16 NOTE — Telephone Encounter (Signed)
 I understand.  Entirely his decision.

## 2023-12-17 ENCOUNTER — Encounter: Payer: Self-pay | Admitting: Gastroenterology

## 2023-12-21 ENCOUNTER — Encounter: Payer: Self-pay | Admitting: Gastroenterology

## 2024-01-30 ENCOUNTER — Encounter: Payer: Self-pay | Admitting: Gastroenterology

## 2024-03-08 ENCOUNTER — Ambulatory Visit: Admitting: Emergency Medicine
